# Patient Record
Sex: Female | Born: 1965 | Race: Black or African American | Hispanic: No | Marital: Married | Smoking: Former smoker
Health system: Southern US, Community
[De-identification: ages and names within clinical notes are randomized; demographics above are authoritative.]

## PROBLEM LIST (undated history)

## (undated) DIAGNOSIS — F32A Depression, unspecified: Secondary | ICD-10-CM

## (undated) DIAGNOSIS — Z8701 Personal history of pneumonia (recurrent): Secondary | ICD-10-CM

## (undated) DIAGNOSIS — J189 Pneumonia, unspecified organism: Secondary | ICD-10-CM

## (undated) DIAGNOSIS — D693 Immune thrombocytopenic purpura: Secondary | ICD-10-CM

## (undated) DIAGNOSIS — I10 Essential (primary) hypertension: Secondary | ICD-10-CM

## (undated) DIAGNOSIS — M5020 Other cervical disc displacement, unspecified cervical region: Secondary | ICD-10-CM

## (undated) DIAGNOSIS — R7303 Prediabetes: Secondary | ICD-10-CM

## (undated) DIAGNOSIS — F419 Anxiety disorder, unspecified: Secondary | ICD-10-CM

## (undated) DIAGNOSIS — Z8669 Personal history of other diseases of the nervous system and sense organs: Secondary | ICD-10-CM

## (undated) DIAGNOSIS — D869 Sarcoidosis, unspecified: Secondary | ICD-10-CM

## (undated) DIAGNOSIS — F329 Major depressive disorder, single episode, unspecified: Secondary | ICD-10-CM

## (undated) DIAGNOSIS — K219 Gastro-esophageal reflux disease without esophagitis: Secondary | ICD-10-CM

## (undated) DIAGNOSIS — G4730 Sleep apnea, unspecified: Secondary | ICD-10-CM

## (undated) HISTORY — DX: Immune thrombocytopenic purpura: D69.3

## (undated) HISTORY — DX: Sarcoidosis, unspecified: D86.9

## (undated) HISTORY — PX: BONE MARROW BIOPSY: SHX199

## (undated) HISTORY — PX: LAPAROSCOPIC SPLENECTOMY: SUR796

## (undated) HISTORY — PX: TUBAL LIGATION: SHX77

## (undated) HISTORY — PX: OTHER SURGICAL HISTORY: SHX169

## (undated) HISTORY — PX: CHOLECYSTECTOMY: SHX55

## (undated) HISTORY — PX: BACK SURGERY: SHX140

## (undated) HISTORY — PX: PERCUTANEOUS LIVER BIOPSY: SUR136

---

## 2008-05-29 ENCOUNTER — Inpatient Hospital Stay (HOSPITAL_COMMUNITY): Admission: EM | Admit: 2008-05-29 | Discharge: 2008-06-02 | Payer: Self-pay | Admitting: Emergency Medicine

## 2008-05-29 ENCOUNTER — Ambulatory Visit: Payer: Self-pay | Admitting: Internal Medicine

## 2008-06-14 ENCOUNTER — Ambulatory Visit: Payer: Self-pay | Admitting: Internal Medicine

## 2008-07-05 LAB — COMPREHENSIVE METABOLIC PANEL
AST: 56 U/L — ABNORMAL HIGH (ref 0–37)
BUN: 11 mg/dL (ref 6–23)
CO2: 24 mEq/L (ref 19–32)
Calcium: 9.3 mg/dL (ref 8.4–10.5)
Chloride: 105 mEq/L (ref 96–112)
Creatinine, Ser: 0.93 mg/dL (ref 0.40–1.20)

## 2008-07-05 LAB — CBC WITH DIFFERENTIAL/PLATELET
Basophils Absolute: 0 10*3/uL (ref 0.0–0.1)
EOS%: 2.2 % (ref 0.0–7.0)
HCT: 36.4 % (ref 34.8–46.6)
HGB: 12.4 g/dL (ref 11.6–15.9)
LYMPH%: 22.7 % (ref 14.0–48.0)
MCH: 27.3 pg (ref 26.0–34.0)
NEUT%: 62.4 % (ref 39.6–76.8)
Platelets: 137 10*3/uL — ABNORMAL LOW (ref 145–400)
lymph#: 1.2 10*3/uL (ref 0.9–3.3)

## 2008-08-21 ENCOUNTER — Ambulatory Visit: Payer: Self-pay | Admitting: Internal Medicine

## 2008-08-21 LAB — CBC WITH DIFFERENTIAL/PLATELET
BASO%: 0.4 % (ref 0.0–2.0)
Basophils Absolute: 0 10*3/uL (ref 0.0–0.1)
HCT: 37 % (ref 34.8–46.6)
HGB: 12.6 g/dL (ref 11.6–15.9)
LYMPH%: 27.5 % (ref 14.0–48.0)
MCHC: 34 g/dL (ref 32.0–36.0)
MONO#: 0.7 10*3/uL (ref 0.1–0.9)
NEUT%: 55 % (ref 39.6–76.8)
Platelets: 39 10*3/uL — ABNORMAL LOW (ref 145–400)
WBC: 5.1 10*3/uL (ref 3.9–10.0)
lymph#: 1.4 10*3/uL (ref 0.9–3.3)

## 2008-09-21 ENCOUNTER — Ambulatory Visit: Payer: Self-pay | Admitting: Internal Medicine

## 2008-09-21 DIAGNOSIS — D869 Sarcoidosis, unspecified: Secondary | ICD-10-CM | POA: Insufficient documentation

## 2008-09-21 DIAGNOSIS — R0609 Other forms of dyspnea: Secondary | ICD-10-CM | POA: Insufficient documentation

## 2008-09-21 DIAGNOSIS — R0989 Other specified symptoms and signs involving the circulatory and respiratory systems: Secondary | ICD-10-CM

## 2008-10-09 ENCOUNTER — Ambulatory Visit: Payer: Self-pay | Admitting: Internal Medicine

## 2008-10-11 LAB — CBC WITH DIFFERENTIAL/PLATELET
Basophils Absolute: 0.1 10*3/uL (ref 0.0–0.1)
Eosinophils Absolute: 0.2 10*3/uL (ref 0.0–0.5)
HGB: 12.7 g/dL (ref 11.6–15.9)
MONO#: 0.8 10*3/uL (ref 0.1–0.9)
MONO%: 10.4 % (ref 0.0–13.0)
NEUT#: 4.1 10*3/uL (ref 1.5–6.5)
RBC: 4.87 10*6/uL (ref 3.70–5.32)
RDW: 14 % (ref 11.3–14.5)
WBC: 7.3 10*3/uL (ref 3.9–10.0)
lymph#: 2.1 10*3/uL (ref 0.9–3.3)

## 2008-10-11 LAB — COMPREHENSIVE METABOLIC PANEL
AST: 41 U/L — ABNORMAL HIGH (ref 0–37)
BUN: 22 mg/dL (ref 6–23)
CO2: 23 mEq/L (ref 19–32)
Calcium: 9.1 mg/dL (ref 8.4–10.5)
Chloride: 105 mEq/L (ref 96–112)
Creatinine, Ser: 1.07 mg/dL (ref 0.40–1.20)
Glucose, Bld: 87 mg/dL (ref 70–99)

## 2008-10-11 LAB — LACTATE DEHYDROGENASE: LDH: 146 U/L (ref 94–250)

## 2008-10-11 LAB — TECHNOLOGIST REVIEW

## 2008-10-18 LAB — CBC WITH DIFFERENTIAL/PLATELET
Basophils Absolute: 0 10*3/uL (ref 0.0–0.1)
Eosinophils Absolute: 0.2 10*3/uL (ref 0.0–0.5)
HGB: 13.1 g/dL (ref 11.6–15.9)
NEUT#: 6.2 10*3/uL (ref 1.5–6.5)
RDW: 15.1 % — ABNORMAL HIGH (ref 11.3–14.5)
WBC: 8.4 10*3/uL (ref 3.9–10.0)
lymph#: 1.3 10*3/uL (ref 0.9–3.3)

## 2008-10-18 LAB — LACTATE DEHYDROGENASE: LDH: 166 U/L (ref 94–250)

## 2008-10-19 ENCOUNTER — Encounter: Admission: RE | Admit: 2008-10-19 | Discharge: 2008-11-27 | Payer: Self-pay | Admitting: Internal Medicine

## 2008-10-23 ENCOUNTER — Ambulatory Visit: Payer: Self-pay | Admitting: Internal Medicine

## 2008-10-25 LAB — CBC WITH DIFFERENTIAL/PLATELET
Eosinophils Absolute: 0.2 10*3/uL (ref 0.0–0.5)
HCT: 37 % (ref 34.8–46.6)
LYMPH%: 26.8 % (ref 14.0–48.0)
MONO#: 0.8 10*3/uL (ref 0.1–0.9)
NEUT#: 4.1 10*3/uL (ref 1.5–6.5)
NEUT%: 58.1 % (ref 39.6–76.8)
Platelets: 38 10*3/uL — ABNORMAL LOW (ref 145–400)
RBC: 4.66 10*6/uL (ref 3.70–5.32)
WBC: 7.1 10*3/uL (ref 3.9–10.0)
lymph#: 1.9 10*3/uL (ref 0.9–3.3)

## 2008-11-01 LAB — CBC WITH DIFFERENTIAL/PLATELET
Basophils Absolute: 0 10*3/uL (ref 0.0–0.1)
Eosinophils Absolute: 0.1 10*3/uL (ref 0.0–0.5)
HGB: 12.6 g/dL (ref 11.6–15.9)
LYMPH%: 13.1 % — ABNORMAL LOW (ref 14.0–48.0)
MCV: 80.4 fL — ABNORMAL LOW (ref 81.0–101.0)
MONO%: 7.5 % (ref 0.0–13.0)
NEUT#: 7.1 10*3/uL — ABNORMAL HIGH (ref 1.5–6.5)
Platelets: 26 10*3/uL — ABNORMAL LOW (ref 145–400)

## 2008-11-06 LAB — CBC WITH DIFFERENTIAL/PLATELET
BASO%: 0.4 % (ref 0.0–2.0)
Basophils Absolute: 0.1 10*3/uL (ref 0.0–0.1)
HCT: 35.8 % (ref 34.8–46.6)
LYMPH%: 10.4 % — ABNORMAL LOW (ref 14.0–48.0)
MCH: 27 pg (ref 26.0–34.0)
MCHC: 33.6 g/dL (ref 32.0–36.0)
MONO#: 0.8 10*3/uL (ref 0.1–0.9)
NEUT%: 82.5 % — ABNORMAL HIGH (ref 39.6–76.8)
Platelets: 53 10*3/uL — ABNORMAL LOW (ref 145–400)
WBC: 12.4 10*3/uL — ABNORMAL HIGH (ref 3.9–10.0)

## 2008-11-17 LAB — CBC WITH DIFFERENTIAL/PLATELET
BASO%: 1.2 % (ref 0.0–2.0)
EOS%: 2 % (ref 0.0–7.0)
HCT: 35.9 % (ref 34.8–46.6)
LYMPH%: 31.4 % (ref 14.0–48.0)
MCH: 26.7 pg (ref 26.0–34.0)
MCHC: 33.8 g/dL (ref 32.0–36.0)
MCV: 79.1 fL — ABNORMAL LOW (ref 81.0–101.0)
MONO%: 9 % (ref 0.0–13.0)
NEUT%: 56.5 % (ref 39.6–76.8)
lymph#: 2.7 10*3/uL (ref 0.9–3.3)

## 2008-11-17 LAB — LACTATE DEHYDROGENASE: LDH: 216 U/L (ref 94–250)

## 2008-11-22 ENCOUNTER — Ambulatory Visit: Payer: Self-pay | Admitting: Internal Medicine

## 2008-11-22 LAB — CBC WITH DIFFERENTIAL/PLATELET
BASO%: 0.6 % (ref 0.0–2.0)
EOS%: 6.3 % (ref 0.0–7.0)
HCT: 34.2 % — ABNORMAL LOW (ref 34.8–46.6)
HGB: 11.6 g/dL (ref 11.6–15.9)
MCH: 27.7 pg (ref 26.0–34.0)
MCHC: 34 g/dL (ref 32.0–36.0)
MONO#: 0.6 10*3/uL (ref 0.1–0.9)
NEUT%: 55.5 % (ref 39.6–76.8)
RDW: 17 % — ABNORMAL HIGH (ref 11.3–14.5)
WBC: 5.7 10*3/uL (ref 3.9–10.0)
lymph#: 1.5 10*3/uL (ref 0.9–3.3)

## 2008-11-22 LAB — LACTATE DEHYDROGENASE: LDH: 190 U/L (ref 94–250)

## 2008-12-08 LAB — CBC WITH DIFFERENTIAL/PLATELET
BASO%: 0.3 % (ref 0.0–2.0)
EOS%: 3.8 % (ref 0.0–7.0)
HCT: 36.7 % (ref 34.8–46.6)
LYMPH%: 26.9 % (ref 14.0–48.0)
MCH: 28.5 pg (ref 26.0–34.0)
MCHC: 34.1 g/dL (ref 32.0–36.0)
MCV: 83.7 fL (ref 81.0–101.0)
MONO%: 13.2 % — ABNORMAL HIGH (ref 0.0–13.0)
NEUT%: 55.8 % (ref 39.6–76.8)
Platelets: 217 10*3/uL (ref 145–400)
lymph#: 1.8 10*3/uL (ref 0.9–3.3)

## 2008-12-08 LAB — LACTATE DEHYDROGENASE: LDH: 195 U/L (ref 94–250)

## 2008-12-11 LAB — CBC WITH DIFFERENTIAL/PLATELET
Basophils Absolute: 0 10*3/uL (ref 0.0–0.1)
Eosinophils Absolute: 0.2 10*3/uL (ref 0.0–0.5)
HGB: 11.9 g/dL (ref 11.6–15.9)
NEUT#: 3.2 10*3/uL (ref 1.5–6.5)
RDW: 17.4 % — ABNORMAL HIGH (ref 11.3–14.5)
lymph#: 1.9 10*3/uL (ref 0.9–3.3)

## 2009-01-04 ENCOUNTER — Ambulatory Visit: Payer: Self-pay | Admitting: Internal Medicine

## 2009-01-04 LAB — CBC WITH DIFFERENTIAL/PLATELET
Basophils Absolute: 0 10*3/uL (ref 0.0–0.1)
Eosinophils Absolute: 0.4 10*3/uL (ref 0.0–0.5)
HGB: 12.7 g/dL (ref 11.6–15.9)
MONO#: 0.7 10*3/uL (ref 0.1–0.9)
NEUT#: 2.2 10*3/uL (ref 1.5–6.5)
RDW: 15.2 % — ABNORMAL HIGH (ref 11.3–14.5)
lymph#: 1.4 10*3/uL (ref 0.9–3.3)

## 2009-01-09 LAB — CBC WITH DIFFERENTIAL/PLATELET
Basophils Absolute: 0 10*3/uL (ref 0.0–0.1)
Eosinophils Absolute: 0.3 10*3/uL (ref 0.0–0.5)
HGB: 12.1 g/dL (ref 11.6–15.9)
LYMPH%: 27 % (ref 14.0–48.0)
MCV: 81.5 fL (ref 81.0–101.0)
MONO%: 12 % (ref 0.0–13.0)
NEUT#: 3.6 10*3/uL (ref 1.5–6.5)
Platelets: 334 10*3/uL (ref 145–400)

## 2009-01-18 LAB — CBC WITH DIFFERENTIAL/PLATELET
BASO%: 0.4 % (ref 0.0–2.0)
Basophils Absolute: 0 10*3/uL (ref 0.0–0.1)
HCT: 36.8 % (ref 34.8–46.6)
HGB: 12.6 g/dL (ref 11.6–15.9)
LYMPH%: 23.4 % (ref 14.0–48.0)
MCH: 28 pg (ref 26.0–34.0)
MCHC: 34.2 g/dL (ref 32.0–36.0)
MONO#: 0.8 10*3/uL (ref 0.1–0.9)
NEUT%: 53.8 % (ref 39.6–76.8)
Platelets: 185 10*3/uL (ref 145–400)
WBC: 5.1 10*3/uL (ref 3.9–10.0)

## 2009-02-07 LAB — CBC WITH DIFFERENTIAL/PLATELET
Eosinophils Absolute: 0.2 10*3/uL (ref 0.0–0.5)
MCV: 81.9 fL (ref 79.5–101.0)
MONO%: 15.7 % — ABNORMAL HIGH (ref 0.0–14.0)
NEUT#: 3 10*3/uL (ref 1.5–6.5)
RBC: 4.35 10*6/uL (ref 3.70–5.45)
RDW: 15.4 % — ABNORMAL HIGH (ref 11.2–14.5)
WBC: 5.2 10*3/uL (ref 3.9–10.3)
lymph#: 1.1 10*3/uL (ref 0.9–3.3)

## 2009-02-07 LAB — LACTATE DEHYDROGENASE: LDH: 173 U/L (ref 94–250)

## 2009-02-20 ENCOUNTER — Ambulatory Visit: Payer: Self-pay | Admitting: Internal Medicine

## 2009-02-22 LAB — CBC WITH DIFFERENTIAL/PLATELET
BASO%: 0.5 % (ref 0.0–2.0)
HCT: 38 % (ref 34.8–46.6)
MCHC: 33.2 g/dL (ref 31.5–36.0)
MONO#: 1 10*3/uL — ABNORMAL HIGH (ref 0.1–0.9)
NEUT%: 49.7 % (ref 38.4–76.8)
RBC: 4.77 10*6/uL (ref 3.70–5.45)
WBC: 5.9 10*3/uL (ref 3.9–10.3)
lymph#: 1.7 10*3/uL (ref 0.9–3.3)
nRBC: 0 % (ref 0–0)

## 2009-03-08 LAB — CBC WITH DIFFERENTIAL/PLATELET
BASO%: 0.6 % (ref 0.0–2.0)
EOS%: 4.5 % (ref 0.0–7.0)
HCT: 36.9 % (ref 34.8–46.6)
LYMPH%: 20.9 % (ref 14.0–49.7)
MCH: 28 pg (ref 25.1–34.0)
MCHC: 34.2 g/dL (ref 31.5–36.0)
NEUT%: 62 % (ref 38.4–76.8)
Platelets: 151 10*3/uL (ref 145–400)
RBC: 4.5 10*6/uL (ref 3.70–5.45)

## 2009-03-08 LAB — LACTATE DEHYDROGENASE: LDH: 200 U/L (ref 94–250)

## 2009-03-23 LAB — CBC WITH DIFFERENTIAL/PLATELET
BASO%: 0.9 % (ref 0.0–2.0)
EOS%: 4.6 % (ref 0.0–7.0)
MCH: 27.2 pg (ref 25.1–34.0)
MCHC: 33.7 g/dL (ref 31.5–36.0)
MCV: 80.7 fL (ref 79.5–101.0)
MONO%: 10.9 % (ref 0.0–14.0)
RBC: 4.82 10*6/uL (ref 3.70–5.45)
RDW: 14.9 % — ABNORMAL HIGH (ref 11.2–14.5)

## 2009-04-05 ENCOUNTER — Encounter (INDEPENDENT_AMBULATORY_CARE_PROVIDER_SITE_OTHER): Payer: Self-pay | Admitting: General Surgery

## 2009-04-05 ENCOUNTER — Inpatient Hospital Stay (HOSPITAL_COMMUNITY): Admission: RE | Admit: 2009-04-05 | Discharge: 2009-04-08 | Payer: Self-pay | Admitting: General Surgery

## 2009-04-16 ENCOUNTER — Ambulatory Visit: Payer: Self-pay | Admitting: Internal Medicine

## 2009-04-18 LAB — CBC WITH DIFFERENTIAL/PLATELET
Basophils Absolute: 0.1 10*3/uL (ref 0.0–0.1)
EOS%: 7.1 % — ABNORMAL HIGH (ref 0.0–7.0)
Eosinophils Absolute: 1 10*3/uL — ABNORMAL HIGH (ref 0.0–0.5)
LYMPH%: 16.5 % (ref 14.0–49.7)
MCH: 27.6 pg (ref 25.1–34.0)
MCV: 83.3 fL (ref 79.5–101.0)
MONO%: 15.4 % — ABNORMAL HIGH (ref 0.0–14.0)
NEUT#: 8.1 10*3/uL — ABNORMAL HIGH (ref 1.5–6.5)
Platelets: 772 10*3/uL — ABNORMAL HIGH (ref 145–400)
RBC: 4.47 10*6/uL (ref 3.70–5.45)
RDW: 15.9 % — ABNORMAL HIGH (ref 11.2–14.5)

## 2009-04-18 LAB — LACTATE DEHYDROGENASE: LDH: 197 U/L (ref 94–250)

## 2009-05-06 ENCOUNTER — Emergency Department (HOSPITAL_COMMUNITY): Admission: EM | Admit: 2009-05-06 | Discharge: 2009-05-06 | Payer: Self-pay | Admitting: Emergency Medicine

## 2009-07-16 ENCOUNTER — Ambulatory Visit: Payer: Self-pay | Admitting: Internal Medicine

## 2010-10-17 ENCOUNTER — Emergency Department (HOSPITAL_COMMUNITY): Admission: EM | Admit: 2010-10-17 | Discharge: 2010-10-18 | Payer: Self-pay | Admitting: Emergency Medicine

## 2011-02-08 ENCOUNTER — Emergency Department (HOSPITAL_COMMUNITY)
Admission: EM | Admit: 2011-02-08 | Discharge: 2011-02-08 | Disposition: A | Discharge status: Home / Self Care | Payer: Medicare Other | Attending: Emergency Medicine | Admitting: Emergency Medicine

## 2011-02-08 DIAGNOSIS — M5430 Sciatica, unspecified side: Secondary | ICD-10-CM | POA: Insufficient documentation

## 2011-02-08 DIAGNOSIS — J45909 Unspecified asthma, uncomplicated: Secondary | ICD-10-CM | POA: Insufficient documentation

## 2011-02-08 DIAGNOSIS — D693 Immune thrombocytopenic purpura: Secondary | ICD-10-CM | POA: Insufficient documentation

## 2011-02-08 DIAGNOSIS — D869 Sarcoidosis, unspecified: Secondary | ICD-10-CM | POA: Insufficient documentation

## 2011-02-11 LAB — RAPID STREP SCREEN (MED CTR MEBANE ONLY): Streptococcus, Group A Screen (Direct): NEGATIVE

## 2011-03-08 ENCOUNTER — Emergency Department (HOSPITAL_COMMUNITY)
Admission: EM | Admit: 2011-03-08 | Discharge: 2011-03-09 | Disposition: A | Discharge status: Home / Self Care | Payer: Medicare Other | Attending: Emergency Medicine | Admitting: Emergency Medicine

## 2011-03-08 DIAGNOSIS — M7989 Other specified soft tissue disorders: Secondary | ICD-10-CM | POA: Insufficient documentation

## 2011-03-08 DIAGNOSIS — D869 Sarcoidosis, unspecified: Secondary | ICD-10-CM | POA: Insufficient documentation

## 2011-03-09 LAB — POCT I-STAT, CHEM 8
Calcium, Ion: 1.19 mmol/L (ref 1.12–1.32)
HCT: 41 % (ref 36.0–46.0)
TCO2: 27 mmol/L (ref 0–100)

## 2011-03-10 LAB — COMPREHENSIVE METABOLIC PANEL
AST: 66 U/L — ABNORMAL HIGH (ref 0–37)
Albumin: 3.2 g/dL — ABNORMAL LOW (ref 3.5–5.2)
Chloride: 104 mEq/L (ref 96–112)
Creatinine, Ser: 0.74 mg/dL (ref 0.4–1.2)
GFR calc Af Amer: >60 mL/min (ref >60)
Potassium: 3.8 mEq/L (ref 3.5–5.1)
Total Bilirubin: 0.7 mg/dL (ref 0.3–1.2)

## 2011-03-10 LAB — DIFFERENTIAL
Basophils Absolute: 0.1 10*3/uL (ref 0.0–0.1)
Eosinophils Relative: 6 % — ABNORMAL HIGH (ref 0–5)
Lymphocytes Relative: 18 % (ref 12–46)
Monocytes Absolute: 1.7 10*3/uL — ABNORMAL HIGH (ref 0.1–1.0)
Monocytes Relative: 13 % — ABNORMAL HIGH (ref 3–12)

## 2011-03-10 LAB — URINALYSIS, ROUTINE W REFLEX MICROSCOPIC
Glucose, UA: NEGATIVE mg/dL
Hgb urine dipstick: NEGATIVE
Specific Gravity, Urine: 1.019 (ref 1.005–1.030)
pH: 5.5 (ref 5.0–8.0)

## 2011-03-10 LAB — URINE CULTURE: Colony Count: 3000

## 2011-03-10 LAB — CBC
MCV: 83.4 fL (ref 78.0–100.0)
Platelets: 285 10*3/uL (ref 150–400)
WBC: 13.1 10*3/uL — ABNORMAL HIGH (ref 4.0–10.5)

## 2011-03-11 LAB — ABO/RH: ABO/RH(D): A POS

## 2011-03-11 LAB — TYPE AND SCREEN: ABO/RH(D): A POS

## 2011-03-11 LAB — CBC
HCT: 33.9 % — ABNORMAL LOW (ref 36.0–46.0)
HCT: 34.8 % — ABNORMAL LOW (ref 36.0–46.0)
Hemoglobin: 11.5 g/dL — ABNORMAL LOW (ref 12.0–15.0)
MCHC: 34.5 g/dL (ref 30.0–36.0)
Platelets: 151 10*3/uL (ref 150–400)
Platelets: 163 10*3/uL (ref 150–400)
RBC: 4.07 MIL/uL (ref 3.87–5.11)
RDW: 15.5 % (ref 11.5–15.5)
RDW: 15.9 % — ABNORMAL HIGH (ref 11.5–15.5)
RDW: 15.9 % — ABNORMAL HIGH (ref 11.5–15.5)
WBC: 15.5 10*3/uL — ABNORMAL HIGH (ref 4.0–10.5)

## 2011-03-11 LAB — COMPREHENSIVE METABOLIC PANEL
ALT: 45 U/L — ABNORMAL HIGH (ref 0–35)
Albumin: 3.2 g/dL — ABNORMAL LOW (ref 3.5–5.2)
Alkaline Phosphatase: 203 U/L — ABNORMAL HIGH (ref 39–117)
Calcium: 9 mg/dL (ref 8.4–10.5)
Potassium: 3.5 mEq/L (ref 3.5–5.1)
Sodium: 136 mEq/L (ref 135–145)
Total Protein: 8.3 g/dL (ref 6.0–8.3)

## 2011-03-11 LAB — DIFFERENTIAL
Basophils Relative: 1 % (ref 0–1)
Eosinophils Absolute: 0.2 10*3/uL (ref 0.0–0.7)
Lymphs Abs: 1.6 10*3/uL (ref 0.7–4.0)
Monocytes Absolute: 0.6 10*3/uL (ref 0.1–1.0)
Monocytes Relative: 10 % (ref 3–12)
Neutro Abs: 3.6 10*3/uL (ref 1.7–7.7)

## 2011-03-11 LAB — PROTIME-INR: INR: 1 (ref 0.00–1.49)

## 2011-04-15 NOTE — Discharge Summary (Signed)
NAMEJAMETTA, [name]                ACCOUNT NO.:  [id number]   MEDICAL RECORD NO.:  [id number]          PATIENT TYPE:  INP   LOCATION:  1319                         FACILITY:  [facility]   PHYSICIAN:  Eduard Clos, MDDATE OF BIRTH:  01-14-1966   DATE OF ADMISSION:  05/29/2008  DATE OF DISCHARGE:                               DISCHARGE SUMMARY   COURSE IN THE HOSPITAL:  [age]-year-old female with known history  of IDP, sarcoidosis, bronchial asthma presented to the ER complaining of  increasing bruising of the skin.  On admission the patient had a CBC  done which showed a platelet count around 12.  The patient was admitted  and got a unit of platelet transfusion.  Hematologist Dr. Arbutus Ped was  consulted.  Per Dr. Arbutus Ped IV steroids were started and advised p.r.n.  platelet transfusion as needed.  The patient's platelet count slowly  started improving, and per hematologist they have found more than  50,000.  He can be discharged home. During his stay the patient also had  UA which had features compatible with UTI.  Cultures only grew 6000  colonies of E. coli.  The patient also had a CAT scan of her head done  due to headache which did not show any acute findings at this time.  As  the patient's platelet counts have increased more than 8000 the patient  is discharged home with advice to follow up with Dr. Arbutus Ped on June 05, 2008 for further workup.  At time of discharge the patient is  hemodynamically stable.   ASSESSMENT:  1. 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  2. Urinary tract infection.  3. Sarcoidosis.  4. History of bronchial asthma.   MEDICATIONS AT DISCHARGE:  1. Medrol 24 mg p.o. daily.  2. Albuterol inhaler 2 puffs q.6 p.r.n.  3. Advair Diskus 250/50 1 puff twice daily.  4. Ciprofloxacin p.o. b.i.d. for three days.   PLAN:  The patient advised to follow with Dr. Arbutus Ped, hematologist, at  the [facility] on  June 05, 2008 to follow with her primary  care physician within a week's time.  The patient advised that  ciprofloxacin should not be taken if planning pregnancy.      Eduard Clos, MD  Electronically Signed     ANK/MEDQ  D:  06/02/2008  T:  06/02/2008  Job:  [id number]

## 2011-04-15 NOTE — Consult Note (Signed)
[name], [name]                ACCOUNT NO.:  [id number]   MEDICAL RECORD NO.:  [id number]          PATIENT TYPE:  INP   LOCATION:  1601                         FACILITY:  [facility]   PHYSICIAN:  Lajuana Matte, MD  DATE OF BIRTH:  11-Apr-1966   DATE OF CONSULTATION:  05/30/2008  DATE OF DISCHARGE:                                 CONSULTATION   REASON FOR CONSULTATION:  Thrombocytopenia.   HISTORY:  We were asked to see this pleasant 45 year old African  American female with a known history of ITP sarcoidosis and  hepatosplenomegaly for thrombocytopenia.  [name] recently moved to  [place] from [place], [place].  She was seen at an urgent  care facility for complaints of fatigue, found to have a UTI and low  platelet counts, and was sent to the emergency room for further  evaluation and management.  Her platelet count in the emergency room  here at [facility] was 12,000.   REVIEW OF SYSTEMS:  The patient noted bruising on her upper and lower  extremities with associated fatigue.  She also had dysuria.  No fevers  or chills.  The remainder of her review of systems was negative.   PAST MEDICAL HISTORY:  Significant for a history of sarcoidosis, ITP,  and bronchial asthma.  Her sarcoidosis symptoms began in 1990, but were  not diagnosed until 1994.  She had previously been treated with platelet  transfusion and steroids.  Regarding her history of ITP, her platelets  were found to be low at the time of evaluation for a herniated cervical  disk in 2003.  She was hospitalized in April of 2007 for profuse vaginal  bleeding, and at that time was diagnosed with ITP.  She has a history of  hepatosplenomegaly related to her sarcoidosis.  She was hospitalized for  bilateral pneumonia in the past.  She is status post cholecystectomy, C-  section x2, tubal ligation.  She is also status post liver biopsy x2.  She has had 2 rigid and 2 flexible bronchoscopies.  She is  status post  left stapedectomy and had some associated Bell's palsy.  She had bone  marrow biopsy in January of 2003.  She had a colonoscopy/endoscopy done  some time between 2001 and 2002.   FAMILY HISTORY:  Unknown, as the patient is adopted.   SOCIAL HISTORY:  She is married.  Has 2 children.  She is currently on  disability, but previously worked in Clinical biochemist.  She briefly  smoked as a teenager, but none since that time.  She denied any drug or  alcohol use or abuse.   ALLERGIES:  The patient is allergic to codeine, which causes hives.   PHYSICAL EXAMINATION:  Reveals a blood pressure of 125/78 with a pulse  of 73, temperature 98.0, respirations 18, oxygen saturation rate 95% on  room air.  GENERAL:  The patient is awake and alert, in no acute distress.  She is  oriented to person, time, and place.  HEENT:  Normocephalic, atraumatic.  Oropharynx is entirely clear.  NECK:  Supple without lymphadenopathy.  CHEST:  Clear to auscultation without wheeze, rales, or rhonchi.  No  dullness to percussion.  CARDIOVASCULAR:  Revealed a regular rate and rhythm with a normal S1,  S2.  No appreciable murmurs, gallops, or rubs.  ABDOMEN:  Obese, soft, nontender, nondistended.  No appreciable masses.  EXTREMITIES:  Her lower extremities are without edema.  However, there  were scattered areas of ecchymosis on the upper and lower extremities in  varying stages of healing.   LABORATORY DATA:  Revealed a white count of 5.0 with an ANC of 11.7,  hematocrit 34.5, platelets 21,000.  Sodium 138, potassium 3.7, chloride  107, CO2 20, BUN 12, creatinine 0.88, glucose 92, calcium 9.1, alkaline  phosphatase 211.  Remainder of the CMET was within normal range.   ASSESSMENT AND PLAN:  1. Thrombocytopenia in a patient with a history of known idiopathic      thrombocytopenic purpura.  We would recommend that her current      steroids be changed to Decadron 20 mg IV given b.i.d. and continue       to monitor a platelet count.  Transfuse platelets as needed.  Also      recommend obtaining her records from her [place] area doctors.  2. For her urinary tract infection, asthma, and sarcoidosis, continued      treatment and management per the [facility] physician.   Thank you for allowing [place] to take part in [name]' care.  We will  continue to follow her with you during this admission.      Tiana Loft, PA.      Lajuana Matte, MD  Electronically Signed    AJ/MEDQ  D:  05/31/2008  T:  05/31/2008  Job:  [id number]

## 2011-04-15 NOTE — H&P (Signed)
[name], BOSS                ACCOUNT NO.:  [id number]   MEDICAL RECORD NO.:  [id number]          PATIENT TYPE:  EMS   LOCATION:  ED                           FACILITY:  [facility]   PHYSICIAN:  Eduard Clos, MDDATE OF BIRTH:  [birth year]   DATE OF ADMISSION:  05/29/2008  DATE OF DISCHARGE:                              HISTORY & PHYSICAL   CHIEF COMPLAINT:  The patient came to the ER for low platelets.   HISTORY OF PRESENT ILLNESS:  A 45 year old female with known history of  ITP, sarcoidosis with hepatosplenomegaly, bronchial asthma who came to  the ER because the patient stated that she was found to have low  platelets at a clinic when she had her blood work done. The patient  stated that she was feeling fatigued and had some bruises on her  extremities. Told that her ITP was getting evaluated. Went to a local  clinic where she had a CBC done. At that time her platelets were found  to be around 30,000. She came to the ER. In the ER, the patient had a  platelet count of 12,000 with bruises in the extremities. The patient  was admitted for further workup and management of her thrombocytopenia.  The patient states that over the last two days she has been having some  fatigue which usually happens when her ITP exacerbates.  The patient was  found to have bruises all over her upper and lower extremities, chest  and back. The patient denies any frank bleeding in the rectum or any  vaginal bleeding. The patient did notice some stool with abnormal color  two days ago and what she thought was the regular brown color yesterday  and today. Denies any abdominal pain, nausea, vomiting, dizziness,  headache, any weakness of limb, fever or chills. The patient states that  she did get admitted for similar complaints in April and February when  she was transfused platelets and given steroids.   PAST MEDICAL HISTORY:  1. ITP.  2. Sarcoidosis.  3. Bronchial asthma.   PAST SURGICAL  HISTORY:  1. Tubal ligation.  2. Cesarean section.  3. Cholecystectomy.  4. Left ear stapedectomy.   MEDICATIONS PRIOR TO ADMISSION:  1. Medrol 24 mg p.o. daily.  2. Albuterol inhaler and nebulizer as needed.  3. Advair discus.  4. The patient also takes Alcalak.   ALLERGIES:  CODEINE.   FAMILY HISTORY:  Noncontributory.   SOCIAL HISTORY:  The patient lives with her husband. Denies smoking  cigarettes, drinking alcohol or using any illegal drugs.   REVIEW OF SYSTEMS:  As per history or presenting illness.   PHYSICAL EXAMINATION:  The patient is examined at bedside. Is not in  acute distress.  VITAL SIGNS: Blood pressure 110/60, pulse 80, temperature 98.1  GENERAL: Alert and oriented to place, time, and person.  HEENT: Anicteric, no pallor.  CHEST:  Bilaterally air entry present. No rhonchi on auscultation.  HEART:  S1, S2 heard.  ABDOMEN: Soft with hepatosplenomegaly. Bowel sounds heard. No guarding  or rigidity.  CNS:  The patient is alert, oriented to time,  place and person.  There are multiple bruises on lower extremities and also a large bruise  on her right arm with smaller bruises over her back and lower  extremities.   LABORATORY DATA:  CBC: WBC 6.5, hemoglobin 14.3, hematocrit 42,  platelets 12, neutrophils 62%.  Basic metabolic panel:  Sodium 140,  potassium 4, chloride 108, glucose 78, BUN 16, creatinine 1.   ASSESSMENT:  1. Idiopathic thrombocytopenia with history of idiopathic      thrombocytopenic purpura.  2. Sarcoidosis.  3. History of bronchial asthma.  4. Hard of hearing on the left side.  5. Obesity.   PLAN:  To admit the patient to medical floor to [facility].  The patient is already receiving blood transfusion. I have discussed  with on-call hematologist, Dr. Arbutus Ped who advised to continue with the  platelet transfusion and oral Medrol dose as she was taking at home. To  recheck a CBC, hemoglobin with PT/INR now and Dr. Arbutus Ped will  be  following the patient. Further recommendation as the patient's condition  evolves. Will try to obtain records from her previous physician at  [facility] where she had biopsies of her liver, spleen and bone marrow  done along with previous records for further medical management.      Eduard Clos, MD  Electronically Signed     ANK/MEDQ  D:  05/29/2008  T:  05/29/2008  Job:  [id number]

## 2011-04-15 NOTE — Op Note (Signed)
[name], [name]                ACCOUNT NO.:  [id number]   MEDICAL RECORD NO.:  [id number]          PATIENT TYPE:  OIB   LOCATION:  5127                         FACILITY:  [facility]   PHYSICIAN:  Adolph Pollack, M.D.DATE OF BIRTH:  06-Nov-1966   DATE OF PROCEDURE:  DATE OF DISCHARGE:                               OPERATIVE REPORT   PREOPERATIVE DIAGNOSIS:  ITP.   POSTOPERATIVE DIAGNOSIS:  ITP.   PROCEDURE:  Laparoscopic splenectomy.   SURGEON:  Adolph Pollack, MD   ASSISTANT:  Troy Sine. Dwain Sarna, MD   ANESTHESIA:  General.   INDICATIONS:  This 45 year old female has a long-standing history of ITP  requiring multiple treatments with with steroids.  She is somewhat  refractory to those and requires IVIG.  She now presents for  laparoscopic splenectomy.   TECHNIQUE:  She was brought to the operating room, placed supine on the  operating table, and a general anesthetic was administered.  She was  then placed with her left side up 45 degrees and the area was padded  appropriately.  Left arm was then placed somewhat above the head and  padded appropriately.  The abdominal wall, flank, and half of the back  on the left side was sterilely prepped and draped.   She was then turned supine.  A previous small subumbilical scar was  reincised and carried down to the fascia where a small incision was made  in the fascia and into the peritoneal cavity under direct vision.  A  Hasson trocar was introduced into the peritoneal cavity and  pneumoperitoneum created by insufflation of CO2 gas.   Next, an angle laparoscope was introduced.  I then placed a 5-mm trocar  through an incision in the subxiphoid region just to the left.  A 12-mm  trocar was placed in the left axillary posterior line.  An 11-mm trocar  was placed in the left axillary anterior line.  A 5-mm trocar was then  placed in the left medial abdomen.   Visualizing the liver appeared to be somewhat scarred.  The spleen  was  slightly enlarged.  I began by dissecting lateral attachments of the  spleen to the diaphragm and colon , dividing these with the Harmonic  scalpel mobilizing the lateral aspect of the spleen.  I subsequently  then continued the mobilization until I approached the hilum.  I then  began dividing short gastric vessels with the larger vessels divided  between clips using the Harmonic scalpel.  The small vessels were  divided with the Harmonic scalpel alone.  I then began dividing  attachments between the diaphragm and the medial and posterior aspect of  the spleen.  I did this until I had isolated the hilar vessels.  I  looked in the gastrohepatic ligament and I did not see any accessory  spleens.   I then held up the spleen between 2 instruments.  Using a vascular  stapler, the blood vessels at the hilum of the spleen close to the  spleen were then divided.  Hemostasis was adequate along the staple  line.  This is away  from the pancreatic tail.   Following this, there remained attachments between the superior aspect  of the spleen and diaphragm which were divided with the Harmonic  scalpel.  Once this was performed, I then removed the trocar in the  anterior axillary line and made a larger skin incision.  A retrieval bag  was then placed through the this incision, and the spleen was then put  into the retrieval bag and it was closed.  I then was able to pull part  of the bag out.  I then removed the spleen via morcellating it manually  with ring forceps.  There was no spillage of spleen contents.   Following this, gloves were changed.  I then inspected the left upper  quadrant and no active bleeding was noted.  The area was irrigated.  FloSeal was placed against the diaphragm and along the staple line.   The left posterior axillary line trocar was removed and the fascial  defect closed with 0 Vicryl suture using Endoclose device.  The anterior  axillary line fascial defect was  closed with a figure-of-eight 0 Vicryl  suture.  The remaining trocars were removed.  The supraumbilical fascial  defect was closed with 0 Vicryl suture.   All skin incisions were then closed with 4-0 Monocryl subcuticular  stitches followed by Steri-Strips and sterile dressings.  She tolerated  the procedure without any apparent complications and was taken to the  recovery in satisfactory condition.       Adolph Pollack, M.D.  Electronically Signed     TJR/MEDQ  D:  04/05/2009  T:  04/06/2009  Job:  [id number]   cc:   Lajuana Matte, MD

## 2011-04-15 NOTE — H&P (Signed)
[name], [name]                ACCOUNT NO.:  [id number]   MEDICAL RECORD NO.:  [id number]          PATIENT TYPE:  OIB   LOCATION:  5127                         FACILITY:  [facility]   PHYSICIAN:  Adolph Pollack, M.D.DATE OF BIRTH:  September 09, 1966   DATE OF ADMISSION:  04/05/2009  DATE OF DISCHARGE:                              HISTORY & PHYSICAL   REASON:  Elective splenectomy.   HISTORY:  MS. [name] is a 45 year old female who has had ITP for a number  of years.  She also has sarcoidosis.  She has been treated with multiple  rounds of steroids and the platelet count hovering around 50,000 when I  first saw her in December 2009.  She had been referred for an elective  splenectomy.  She wanted to complete her semester at school in the  spring and subsequently presents now for elective splenectomy.  Preoperatively, she has received her vaccines.   PAST MEDICAL HISTORY:  1. ITP.  2. Sarcoidosis with multiple hospitalizations for the pulmonary      issues.   PREVIOUS OPERATIONS:  1. Liver biopsies.  2. Scaphoidectomy.  3. Laparoscopic cholecystectomy.  4. Tubal ligation.  5. Two cesarean sections.   ALLERGIES:  CODEINE.   CURRENT MEDICATIONS:  Lasix and Klor-Con.   SOCIAL HISTORY:  She is married.  No tobacco or alcohol use.   FAMILY HISTORY:  She is adopted, so it is noncontributory.   PHYSICAL EXAMINATION:  GENERAL:  An obese female, who is in no acute  distress.  She is pleasant and cooperative.  VITAL SIGNS:  Temperature is 98 degrees, blood pressure is 120/75, pulse  71, O2 saturations 98% on room air.  NECK:  Supple without masses.  RESPIRATORY:  Breath sounds equal and clear, respirations unlabored.  CARDIOVASCULAR:  Regular rate, regular rhythm.  No murmur.  ABDOMEN:  Soft with lower midline scar and smaller supraumbilical scars.  I cannot obviously palpate a spleen or a liver at this time.  EXTREMITIES:  SCDs on.   LABORATORY DATA:  Notable for a platelet count  of 151,000 after  receiving intravenous immunoglobulin.  Liver function is also noted to  be mildly elevated except for a normal bilirubin.   IMPRESSION:  Idiopathic thrombocytopenic purpura.   PLAN:  Laparoscopic possible open splenectomy.      Adolph Pollack, M.D.  Electronically Signed     TJR/MEDQ  D:  04/05/2009  T:  04/06/2009  Job:  [id number]

## 2011-04-15 NOTE — Discharge Summary (Signed)
NAMEHEILY, [name]                ACCOUNT NO.:  [id number]   MEDICAL RECORD NO.:  [id number]          PATIENT TYPE:  INP   LOCATION:  5127                         FACILITY:  [facility]   PHYSICIAN:  Adolph Pollack, M.D.DATE OF BIRTH:  02/24/1966   DATE OF ADMISSION:  04/05/2009  DATE OF DISCHARGE:  04/08/2009                               DISCHARGE SUMMARY   DISCHARGE DIAGNOSIS:  Idiopathic thrombocytopenic purpura.   SECONDARY DIAGNOSIS:  Sarcoidosis.   PROCEDURE:  Laparoscopic splenectomy.   INDICATIONS:  This 45 year old female has had ITP, but is now becoming  refractory to some medical treatment.  Now she is admitted for elective  splenectomy.   HOSPITAL COURSE:  She underwent a laparoscopic splenectomy, which she  tolerated well.  On her first postop day, her platelet count was up to  163,000.  Her Foley was removed and she was mobilized.  She continued to  improve slowly and then was able to go home on Apr 08, 2009, in  satisfactory condition.   DISPOSITION:  Discharge to home on Apr 08, 2009, in satisfactory  condition.  Final pathology demonstrated splenic tissue with scattered  sarcoid-like granulomatous inflammation.  Her activity is limited to no  heavy lifting.  She will resume her home medicines and then was given  prescription for Percocet for pain.  She will follow up in the office in  2-3 weeks.      Adolph Pollack, M.D.  Electronically Signed     TJR/MEDQ  D:  05/07/2009  T:  05/08/2009  Job:  [id number]   cc:   Lajuana Matte, MD

## 2011-04-24 ENCOUNTER — Encounter: Payer: Self-pay | Admitting: Internal Medicine

## 2011-05-07 ENCOUNTER — Ambulatory Visit: Payer: Medicare Other | Admitting: Internal Medicine

## 2011-06-05 ENCOUNTER — Emergency Department (HOSPITAL_COMMUNITY): Payer: Medicare Other

## 2011-06-05 ENCOUNTER — Emergency Department (HOSPITAL_COMMUNITY)
Admission: EM | Admit: 2011-06-05 | Discharge: 2011-06-06 | Disposition: A | Discharge status: Home / Self Care | Payer: Medicare Other | Attending: Emergency Medicine | Admitting: Emergency Medicine

## 2011-06-05 DIAGNOSIS — R000 Tachycardia, unspecified: Secondary | ICD-10-CM | POA: Insufficient documentation

## 2011-06-05 DIAGNOSIS — D869 Sarcoidosis, unspecified: Secondary | ICD-10-CM | POA: Insufficient documentation

## 2011-06-05 DIAGNOSIS — J329 Chronic sinusitis, unspecified: Secondary | ICD-10-CM | POA: Insufficient documentation

## 2011-06-05 DIAGNOSIS — R0602 Shortness of breath: Secondary | ICD-10-CM | POA: Insufficient documentation

## 2011-06-05 DIAGNOSIS — R062 Wheezing: Secondary | ICD-10-CM | POA: Insufficient documentation

## 2011-06-05 DIAGNOSIS — J189 Pneumonia, unspecified organism: Secondary | ICD-10-CM | POA: Insufficient documentation

## 2011-06-17 ENCOUNTER — Emergency Department (HOSPITAL_COMMUNITY)
Admission: EM | Admit: 2011-06-17 | Discharge: 2011-06-18 | Disposition: A | Discharge status: Other Acute Inpatient Hospital | Payer: Medicare Other | Attending: Emergency Medicine | Admitting: Emergency Medicine

## 2011-06-17 ENCOUNTER — Emergency Department (HOSPITAL_COMMUNITY): Payer: Medicare Other

## 2011-06-17 DIAGNOSIS — D72829 Elevated white blood cell count, unspecified: Secondary | ICD-10-CM | POA: Insufficient documentation

## 2011-06-17 DIAGNOSIS — R188 Other ascites: Secondary | ICD-10-CM | POA: Insufficient documentation

## 2011-06-17 DIAGNOSIS — Z9889 Other specified postprocedural states: Secondary | ICD-10-CM | POA: Insufficient documentation

## 2011-06-17 DIAGNOSIS — R0602 Shortness of breath: Secondary | ICD-10-CM | POA: Insufficient documentation

## 2011-06-17 DIAGNOSIS — R000 Tachycardia, unspecified: Secondary | ICD-10-CM | POA: Insufficient documentation

## 2011-06-17 DIAGNOSIS — R109 Unspecified abdominal pain: Secondary | ICD-10-CM | POA: Insufficient documentation

## 2011-06-17 DIAGNOSIS — S36119A Unspecified injury of liver, initial encounter: Secondary | ICD-10-CM | POA: Insufficient documentation

## 2011-06-17 DIAGNOSIS — X58XXXA Exposure to other specified factors, initial encounter: Secondary | ICD-10-CM | POA: Insufficient documentation

## 2011-06-17 DIAGNOSIS — J9819 Other pulmonary collapse: Secondary | ICD-10-CM | POA: Insufficient documentation

## 2011-06-17 HISTORY — DX: Essential (primary) hypertension: I10

## 2011-06-17 LAB — COMPREHENSIVE METABOLIC PANEL
AST: 64 U/L — ABNORMAL HIGH (ref 0–37)
BUN: 7 mg/dL (ref 6–23)
CO2: 23 mEq/L (ref 19–32)
Calcium: 9.8 mg/dL (ref 8.4–10.5)
Chloride: 100 mEq/L (ref 96–112)
Creatinine, Ser: 0.76 mg/dL (ref 0.50–1.10)
GFR calc Af Amer: >60 mL/min (ref >60)
GFR calc non Af Amer: >60 mL/min (ref >60)
Glucose, Bld: 127 mg/dL — ABNORMAL HIGH (ref 70–99)
Total Bilirubin: 0.7 mg/dL (ref 0.3–1.2)

## 2011-06-17 LAB — CBC
Hemoglobin: 9.9 g/dL — ABNORMAL LOW (ref 12.0–15.0)
MCH: 28.7 pg (ref 26.0–34.0)
MCHC: 33.8 g/dL (ref 30.0–36.0)
MCV: 84.9 fL (ref 78.0–100.0)
RBC: 3.45 MIL/uL — ABNORMAL LOW (ref 3.87–5.11)

## 2011-06-17 LAB — DIFFERENTIAL
Basophils Relative: 0 % (ref 0–1)
Lymphs Abs: 4.4 10*3/uL — ABNORMAL HIGH (ref 0.7–4.0)
Monocytes Absolute: 2.2 10*3/uL — ABNORMAL HIGH (ref 0.1–1.0)
Monocytes Relative: 11 % (ref 3–12)
Neutro Abs: 13.9 10*3/uL — ABNORMAL HIGH (ref 1.7–7.7)
Neutrophils Relative %: 66 % (ref 43–77)

## 2011-06-17 LAB — LIPASE, BLOOD: Lipase: 65 U/L — ABNORMAL HIGH (ref 11–59)

## 2011-06-17 LAB — OCCULT BLOOD, POC DEVICE: Fecal Occult Bld: NEGATIVE

## 2011-06-18 ENCOUNTER — Encounter (HOSPITAL_COMMUNITY): Payer: Self-pay | Admitting: Radiology

## 2011-06-18 ENCOUNTER — Emergency Department (HOSPITAL_COMMUNITY): Payer: Medicare Other

## 2011-06-18 LAB — URINALYSIS, ROUTINE W REFLEX MICROSCOPIC
Bilirubin Urine: NEGATIVE
Ketones, ur: NEGATIVE mg/dL
Leukocytes, UA: NEGATIVE
Nitrite: NEGATIVE
Protein, ur: 30 mg/dL — AB
pH: 7 (ref 5.0–8.0)

## 2011-06-18 LAB — PRO B NATRIURETIC PEPTIDE: Pro B Natriuretic peptide (BNP): 66.3 pg/mL (ref 0–125)

## 2011-06-18 MED ORDER — IOHEXOL 300 MG/ML  SOLN
100.0000 mL | Freq: Once | INTRAMUSCULAR | Status: AC | PRN
  Administered 2011-06-18: 100 mL via INTRAVENOUS

## 2011-08-28 LAB — COMPREHENSIVE METABOLIC PANEL
ALT: 45 — ABNORMAL HIGH
AST: 68 — ABNORMAL HIGH
AST: 72 — ABNORMAL HIGH
Albumin: 3.2 — ABNORMAL LOW
Alkaline Phosphatase: 211 — ABNORMAL HIGH
Alkaline Phosphatase: 261 — ABNORMAL HIGH
CO2: 24
Chloride: 107
Chloride: 105
Creatinine, Ser: 0.88
GFR calc Af Amer: >60
GFR calc non Af Amer: 55 — ABNORMAL LOW
Glucose, Bld: 202 — ABNORMAL HIGH
Potassium: 3.7
Potassium: 4.2
Sodium: 136
Total Bilirubin: 0.9

## 2011-08-28 LAB — CBC
HCT: 39.3
HCT: 35.8 — ABNORMAL LOW
Hemoglobin: 13.2
Hemoglobin: 12.2
Hemoglobin: 12.7
MCHC: 32.6
MCHC: 33
MCHC: 33.3
MCV: 79.1
MCV: 79.2
Platelets: 21 — CL
Platelets: 52 — ABNORMAL LOW
RBC: 4.53
RBC: 4.66
RBC: 4.85
WBC: 5
WBC: 6.5
WBC: 13 — ABNORMAL HIGH
WBC: 8.7

## 2011-08-28 LAB — DIFFERENTIAL
Basophils Relative: 0
Eosinophils Relative: 4
Lymphocytes Relative: 22
Monocytes Relative: 12
Neutro Abs: 4

## 2011-08-28 LAB — URINALYSIS, ROUTINE W REFLEX MICROSCOPIC
Bilirubin Urine: NEGATIVE
Ketones, ur: NEGATIVE
Nitrite: POSITIVE — AB
pH: 8

## 2011-08-28 LAB — URINE MICROSCOPIC-ADD ON

## 2011-08-28 LAB — CROSSMATCH: Antibody Screen: NEGATIVE

## 2011-08-28 LAB — URINE CULTURE
Colony Count: 6000
Special Requests: NEGATIVE

## 2011-08-28 LAB — PREPARE PLATELET PHERESIS

## 2011-08-28 LAB — POCT I-STAT, CHEM 8
Calcium, Ion: 1.2
HCT: 42
TCO2: 24

## 2011-08-28 LAB — LACTATE DEHYDROGENASE: LDH: 149

## 2011-08-28 LAB — APTT: aPTT: 36

## 2011-08-28 LAB — IRON AND TIBC: Iron: 25 — ABNORMAL LOW

## 2012-01-07 ENCOUNTER — Encounter (HOSPITAL_COMMUNITY): Payer: Self-pay | Admitting: *Deleted

## 2012-01-07 ENCOUNTER — Emergency Department (HOSPITAL_COMMUNITY): Payer: Medicare Other

## 2012-01-07 ENCOUNTER — Inpatient Hospital Stay (HOSPITAL_COMMUNITY)
Admission: EM | Admit: 2012-01-07 | Discharge: 2012-01-13 | DRG: 438 | Disposition: A | Discharge status: Home / Self Care | Payer: Medicare Other | Attending: Internal Medicine | Admitting: Internal Medicine

## 2012-01-07 ENCOUNTER — Other Ambulatory Visit: Payer: Self-pay

## 2012-01-07 DIAGNOSIS — Z79899 Other long term (current) drug therapy: Secondary | ICD-10-CM

## 2012-01-07 DIAGNOSIS — I129 Hypertensive chronic kidney disease with stage 1 through stage 4 chronic kidney disease, or unspecified chronic kidney disease: Secondary | ICD-10-CM | POA: Diagnosis present

## 2012-01-07 DIAGNOSIS — J189 Pneumonia, unspecified organism: Secondary | ICD-10-CM | POA: Diagnosis present

## 2012-01-07 DIAGNOSIS — F329 Major depressive disorder, single episode, unspecified: Secondary | ICD-10-CM | POA: Diagnosis present

## 2012-01-07 DIAGNOSIS — N179 Acute kidney failure, unspecified: Secondary | ICD-10-CM | POA: Diagnosis not present

## 2012-01-07 DIAGNOSIS — Z87891 Personal history of nicotine dependence: Secondary | ICD-10-CM

## 2012-01-07 DIAGNOSIS — Z9089 Acquired absence of other organs: Secondary | ICD-10-CM

## 2012-01-07 DIAGNOSIS — Z9989 Dependence on other enabling machines and devices: Secondary | ICD-10-CM | POA: Diagnosis present

## 2012-01-07 DIAGNOSIS — J90 Pleural effusion, not elsewhere classified: Secondary | ICD-10-CM | POA: Diagnosis present

## 2012-01-07 DIAGNOSIS — N181 Chronic kidney disease, stage 1: Secondary | ICD-10-CM | POA: Diagnosis present

## 2012-01-07 DIAGNOSIS — Z6841 Body Mass Index (BMI) 40.0 and over, adult: Secondary | ICD-10-CM

## 2012-01-07 DIAGNOSIS — T380X5A Adverse effect of glucocorticoids and synthetic analogues, initial encounter: Secondary | ICD-10-CM | POA: Diagnosis not present

## 2012-01-07 DIAGNOSIS — R05 Cough: Secondary | ICD-10-CM

## 2012-01-07 DIAGNOSIS — K859 Acute pancreatitis without necrosis or infection, unspecified: Principal | ICD-10-CM | POA: Diagnosis present

## 2012-01-07 DIAGNOSIS — J9801 Acute bronchospasm: Secondary | ICD-10-CM | POA: Diagnosis present

## 2012-01-07 DIAGNOSIS — F3289 Other specified depressive episodes: Secondary | ICD-10-CM | POA: Diagnosis present

## 2012-01-07 DIAGNOSIS — E871 Hypo-osmolality and hyponatremia: Secondary | ICD-10-CM | POA: Diagnosis present

## 2012-01-07 DIAGNOSIS — R0609 Other forms of dyspnea: Secondary | ICD-10-CM

## 2012-01-07 DIAGNOSIS — R059 Cough, unspecified: Secondary | ICD-10-CM

## 2012-01-07 DIAGNOSIS — I498 Other specified cardiac arrhythmias: Secondary | ICD-10-CM | POA: Diagnosis present

## 2012-01-07 DIAGNOSIS — J45909 Unspecified asthma, uncomplicated: Secondary | ICD-10-CM | POA: Diagnosis present

## 2012-01-07 DIAGNOSIS — R0902 Hypoxemia: Secondary | ICD-10-CM | POA: Diagnosis present

## 2012-01-07 DIAGNOSIS — Z801 Family history of malignant neoplasm of trachea, bronchus and lung: Secondary | ICD-10-CM

## 2012-01-07 DIAGNOSIS — Z885 Allergy status to narcotic agent status: Secondary | ICD-10-CM

## 2012-01-07 DIAGNOSIS — D693 Immune thrombocytopenic purpura: Secondary | ICD-10-CM | POA: Diagnosis present

## 2012-01-07 DIAGNOSIS — R7309 Other abnormal glucose: Secondary | ICD-10-CM | POA: Diagnosis not present

## 2012-01-07 DIAGNOSIS — F32A Depression, unspecified: Secondary | ICD-10-CM | POA: Diagnosis present

## 2012-01-07 DIAGNOSIS — R197 Diarrhea, unspecified: Secondary | ICD-10-CM | POA: Diagnosis present

## 2012-01-07 DIAGNOSIS — I10 Essential (primary) hypertension: Secondary | ICD-10-CM | POA: Diagnosis present

## 2012-01-07 DIAGNOSIS — D869 Sarcoidosis, unspecified: Secondary | ICD-10-CM | POA: Diagnosis present

## 2012-01-07 DIAGNOSIS — R7989 Other specified abnormal findings of blood chemistry: Secondary | ICD-10-CM | POA: Diagnosis present

## 2012-01-07 DIAGNOSIS — M5020 Other cervical disc displacement, unspecified cervical region: Secondary | ICD-10-CM | POA: Diagnosis present

## 2012-01-07 DIAGNOSIS — G4733 Obstructive sleep apnea (adult) (pediatric): Secondary | ICD-10-CM | POA: Diagnosis present

## 2012-01-07 DIAGNOSIS — D72829 Elevated white blood cell count, unspecified: Secondary | ICD-10-CM | POA: Diagnosis present

## 2012-01-07 DIAGNOSIS — E8809 Other disorders of plasma-protein metabolism, not elsewhere classified: Secondary | ICD-10-CM | POA: Diagnosis present

## 2012-01-07 HISTORY — DX: Other cervical disc displacement, unspecified cervical region: M50.20

## 2012-01-07 HISTORY — DX: Personal history of pneumonia (recurrent): Z87.01

## 2012-01-07 HISTORY — DX: Depression, unspecified: F32.A

## 2012-01-07 HISTORY — DX: Major depressive disorder, single episode, unspecified: F32.9

## 2012-01-07 HISTORY — DX: Personal history of other diseases of the nervous system and sense organs: Z86.69

## 2012-01-07 LAB — URINALYSIS, ROUTINE W REFLEX MICROSCOPIC
Bilirubin Urine: NEGATIVE
Glucose, UA: NEGATIVE mg/dL
Hgb urine dipstick: NEGATIVE
Protein, ur: 100 mg/dL — AB
Specific Gravity, Urine: 1.02 (ref 1.005–1.030)

## 2012-01-07 LAB — COMPREHENSIVE METABOLIC PANEL
ALT: 45 U/L — ABNORMAL HIGH (ref 0–35)
AST: 70 U/L — ABNORMAL HIGH (ref 0–37)
Alkaline Phosphatase: 313 U/L — ABNORMAL HIGH (ref 39–117)
CO2: 22 mEq/L (ref 19–32)
Calcium: 9.1 mg/dL (ref 8.4–10.5)
Chloride: 99 mEq/L (ref 96–112)
GFR calc non Af Amer: >90 mL/min (ref >90)
Glucose, Bld: 124 mg/dL — ABNORMAL HIGH (ref 70–99)
Potassium: 3.6 mEq/L (ref 3.5–5.1)
Sodium: 133 mEq/L — ABNORMAL LOW (ref 135–145)

## 2012-01-07 LAB — DIFFERENTIAL
Basophils Absolute: 0 10*3/uL (ref 0.0–0.1)
Basophils Relative: 0 % (ref 0–1)
Eosinophils Relative: 0 % (ref 0–5)
Lymphocytes Relative: 19 % (ref 12–46)
Lymphs Abs: 4.7 10*3/uL — ABNORMAL HIGH (ref 0.7–4.0)
Monocytes Relative: 12 % (ref 3–12)
Neutro Abs: 17 10*3/uL — ABNORMAL HIGH (ref 1.7–7.7)

## 2012-01-07 LAB — CBC
HCT: 35.8 % — ABNORMAL LOW (ref 36.0–46.0)
Hemoglobin: 12.5 g/dL (ref 12.0–15.0)
MCV: 82.1 fL (ref 78.0–100.0)
RBC: 4.36 MIL/uL (ref 3.87–5.11)
WBC: 24.7 10*3/uL — ABNORMAL HIGH (ref 4.0–10.5)

## 2012-01-07 LAB — URINE MICROSCOPIC-ADD ON

## 2012-01-07 LAB — TROPONIN I: Troponin I: <0.3 ng/mL (ref <0.30)

## 2012-01-07 LAB — PREGNANCY, URINE: Preg Test, Ur: NEGATIVE

## 2012-01-07 MED ORDER — ONDANSETRON HCL 4 MG PO TABS: 4.0000 mg | ORAL_TABLET | Freq: Four times a day (QID) | ORAL | Status: DC | PRN

## 2012-01-07 MED ORDER — FENTANYL CITRATE 0.05 MG/ML IJ SOLN
50.0000 ug | Freq: Once | INTRAMUSCULAR | Status: AC
  Administered 2012-01-07: 50 ug via INTRAVENOUS
  Filled 2012-01-07: qty 2

## 2012-01-07 MED ORDER — VANCOMYCIN HCL IN DEXTROSE 1-5 GM/200ML-% IV SOLN
1000.0000 mg | Freq: Two times a day (BID) | INTRAVENOUS | Status: DC
  Administered 2012-01-07 – 2012-01-08 (×2): 1000 mg via INTRAVENOUS
  Filled 2012-01-07 (×4): qty 200

## 2012-01-07 MED ORDER — DEXTROSE 5 % IV SOLN
1.0000 g | INTRAVENOUS | Status: DC
  Administered 2012-01-07 – 2012-01-09 (×3): 1 g via INTRAVENOUS
  Filled 2012-01-07 (×3): qty 10

## 2012-01-07 MED ORDER — AMLODIPINE BESYLATE 5 MG PO TABS
5.0000 mg | ORAL_TABLET | Freq: Every day | ORAL | Status: DC
  Administered 2012-01-07: 5 mg via ORAL
  Filled 2012-01-07 (×2): qty 1

## 2012-01-07 MED ORDER — MORPHINE SULFATE 4 MG/ML IJ SOLN
4.0000 mg | Freq: Once | INTRAMUSCULAR | Status: AC
  Administered 2012-01-07: 4 mg via INTRAVENOUS
  Filled 2012-01-07: qty 1

## 2012-01-07 MED ORDER — VANCOMYCIN HCL 1000 MG IV SOLR
2000.0000 mg | INTRAVENOUS | Status: AC
  Administered 2012-01-07: 2000 mg via INTRAVENOUS
  Filled 2012-01-07: qty 2000

## 2012-01-07 MED ORDER — ALBUTEROL SULFATE (5 MG/ML) 0.5% IN NEBU
INHALATION_SOLUTION | RESPIRATORY_TRACT | Status: AC
  Filled 2012-01-07: qty 1

## 2012-01-07 MED ORDER — SODIUM CHLORIDE 0.9 % IV SOLN
INTRAVENOUS | Status: DC
  Administered 2012-01-07 – 2012-01-08 (×3): via INTRAVENOUS

## 2012-01-07 MED ORDER — HYDROMORPHONE HCL PF 1 MG/ML IJ SOLN
2.0000 mg | INTRAMUSCULAR | Status: DC | PRN
  Administered 2012-01-07 – 2012-01-10 (×7): 2 mg via INTRAVENOUS
  Filled 2012-01-07: qty 2
  Filled 2012-01-07: qty 1
  Filled 2012-01-07: qty 2
  Filled 2012-01-07 (×2): qty 1
  Filled 2012-01-07 (×2): qty 2
  Filled 2012-01-07 (×3): qty 1

## 2012-01-07 MED ORDER — IOHEXOL 300 MG/ML  SOLN
100.0000 mL | Freq: Once | INTRAMUSCULAR | Status: AC | PRN
  Administered 2012-01-07: 100 mL via INTRAVENOUS

## 2012-01-07 MED ORDER — ONDANSETRON HCL 4 MG/2ML IJ SOLN: 4.0000 mg | Freq: Four times a day (QID) | INTRAMUSCULAR | Status: DC | PRN

## 2012-01-07 MED ORDER — DEXTROSE 5 % IV SOLN
500.0000 mg | INTRAVENOUS | Status: DC
  Administered 2012-01-07 – 2012-01-09 (×3): 500 mg via INTRAVENOUS
  Filled 2012-01-07 (×3): qty 500

## 2012-01-07 MED ORDER — FENTANYL CITRATE 0.05 MG/ML IJ SOLN
50.0000 ug | Freq: Once | INTRAMUSCULAR | Status: AC
  Administered 2012-01-07: 07:00:00 via INTRAVENOUS
  Filled 2012-01-07: qty 2

## 2012-01-07 MED ORDER — IPRATROPIUM BROMIDE 0.02 % IN SOLN
0.5000 mg | Freq: Once | RESPIRATORY_TRACT | Status: AC
  Administered 2012-01-07: 0.5 mg via RESPIRATORY_TRACT
  Filled 2012-01-07: qty 2.5

## 2012-01-07 MED ORDER — ALBUTEROL SULFATE (5 MG/ML) 0.5% IN NEBU
5.0000 mg | INHALATION_SOLUTION | Freq: Once | RESPIRATORY_TRACT | Status: AC
  Administered 2012-01-07: 06:00:00 via RESPIRATORY_TRACT
  Filled 2012-01-07: qty 1

## 2012-01-07 MED ORDER — SODIUM CHLORIDE 0.9 % IV BOLUS (SEPSIS)
500.0000 mL | Freq: Once | INTRAVENOUS | Status: AC
  Administered 2012-01-07: 500 mL via INTRAVENOUS

## 2012-01-07 NOTE — H&P (Signed)
Hospital Admission Note Date: 01/07/2012  Patient name: [name] Medical record number: [id number] Date of birth: [birth year] Age: 46 y.o. Gender: female PCP: Elpidio Anis, PA, PA  Attending physician: Geoffery Lyons, MD Emergency Contact: Jeralene Huff (husband) [phone] Code Status:  Full  Chief Complaint: "Abdominal pain"  History of Present Illness: [name] is an 46 y.o. female with PMH of sarcoidosis and ITP s/p splenectomy who presents to the ER with the sudden onset of abdominal pain which began about 4 hours ago.  Pain is in the LUQ and radiates up into chest and back.  Pain is sharp in quality, rated 10/10.  No history of similar pain.  Coughing aggravates symptoms, pain medication, given in the ER, have eased it off some.  The patient also reports recent diarrhea, last episode yesterday, but no melena or hematochezia.  She states she had a recent stomach virus.  No associated nausea or vomiting.  Went to [facility] yesterday with complaints of sore throat and diarrhea.  States family has been sick with similar symptoms.   Reports that she had a flu vaccination this season.  Upon initial evaluation in the ER, the patient was found to have an elevated lipase and elevated LFTs and was subsequently referred to the hospitalist service for further evaluation and treatment.  Past Medical History Past Medical History  Diagnosis Date  . Sarcoidosis   . ITP (idiopathic thrombocytopenic purpura)     Dr. Shirline Frees  . Hypertension   . Asthma   . Depression   . H/O Bell's palsy   . H/O: pneumonia   . Herniated disc, cervical     Past Surgical History Past Surgical History  Procedure Date  . Tubal ligation   . Cholecystectomy   . 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 Cesarian section     x2  . Percutaneous liver biopsy     x2  . Left stapedectomy   . Bone marrow biopsy     12/2001    Meds: Prior to Admission medications   Medication Sig Start Date End Date Taking? Authorizing  Provider  albuterol (ACCUNEB) 0.63 MG/3ML nebulizer solution Take 1 ampule by nebulization every 6 (six) hours as needed.     Yes Historical Provider, MD  citalopram (CELEXA) 20 MG tablet Take 20 mg by mouth daily.   Yes Historical Provider, MD    Allergies: Avelox and Codeine  Social History: History   Social History  . Marital Status: Married    Spouse Name: Meda Coffee    Number of Children: 2  . Years of Education: 17   Occupational History  . Disabled, worked in Clinical biochemist   . Student in public administration    Social History Main Topics  . Smoking status: Former Smoker -- 0.5 packs/day for 5 years    Types: Cigarettes  . Smokeless tobacco: Never Used  . Alcohol Use: No  . Drug Use: No  . Sexually Active: Not on file   Other Topics Concern  . Not on file   Social History Narrative   Married, lives in [place] with husband.  Independent of ADLs and ambulation.    Family History:  Family History  Problem Relation Age of Onset  . Adopted: Yes    Review of Systems: Constitutional: + fever and chills x 24 hours;  Appetite diminished; No weight loss.  HEENT: No blurry vision or diplopia, + pharyngitis (neg strep yesterday), no dysphagia CV: No chest pain or arrhythmia.  Resp: +  SOB, + cough productive of green blood flecked sputum. GI: No N/V, + diarrhea, no melena or hematochezia.  GU: No dysuria or hematuria.  MSK: no myalgias/arthralgias.  Neuro:  No headache or focal neurological deficits.  Psych: + depression, no anxiety.  Endo: No thyroid disease or DM.  Skin: No rashes or lesions.  Heme: No anemia or blood dyscrasia   Physical Exam: Blood pressure 160/83, pulse 64, temperature 97.6 F (36.4 C), temperature source Oral, resp. rate 19, height 5\' 5"  (1.651 m), weight 118.389 kg (261 lb), last menstrual period 12/02/2011, SpO2 100.00%. BP 160/83  Pulse 64  Temp(Src) 97.6 F (36.4 C) (Oral)  Resp 19  Ht 5\' 5"  (1.651 m)  Wt 118.389 kg (261 lb)  BMI 43.43  kg/m2  SpO2 100%  LMP 12/02/2011  General Appearance:    Alert, cooperative, no distress, appears stated age  Head:    Normocephalic, without obvious abnormality, atraumatic  Eyes:    PERRL, conjunctiva/corneas clear, EOM's intact  Ears:    Normal external ear canals, both ears  Nose:   Nares normal, septum midline, mucosa normal, no drainage    or sinus tenderness  Throat:   Lips, mucosa, and tongue normal; teeth and gums normal  Neck:   Supple, symmetrical, trachea midline, no adenopathy;    thyroid:  no enlargement/tenderness/nodules; no carotid   bruit or JVD  Back:     Symmetric, no curvature, ROM normal, no CVA tenderness  Lungs:     Clear to auscultation bilaterally, respirations unlabored  Chest Wall:    No tenderness or deformity   Heart:    Tachycardic rate, regular rhythm, S1 and S2 normal, no murmur, rub   or gallop  Abdomen:     Soft, tender LUQ, bowel sounds active all four quadrants,    no masses, no organomegaly  Extremities:   Extremities normal, atraumatic, no cyanosis or edema  Pulses:   2+ and symmetric all extremities  Skin:   Skin color, texture, turgor normal, sarcoid lesions BLE  Neurologic:   Non-focal   Lab results: Basic Metabolic Panel:  Lab 01/07/12 1610  NA 133*  K 3.6  CL 99  CO2 22  GLUCOSE 124*  BUN 10  CREATININE 0.74  CALCIUM 9.1  MG --  PHOS --   GFR Estimated Creatinine Clearance: 112 ml/min (by C-G formula based on Cr of 0.74). Liver Function Tests:  Lab 01/07/12 0650  AST 70*  ALT 45*  ALKPHOS 313*  BILITOT 0.6  PROT 9.1*  ALBUMIN 3.2*    Lab 01/07/12 0650  LIPASE 78*  AMYLASE --    CBC:  Lab 01/07/12 0650  WBC 24.7*  NEUTROABS 17.0*  HGB 12.5  HCT 35.8*  MCV 82.1  PLT 199   Cardiac Enzymes:  Lab 01/07/12 0650  CKTOTAL --  CKMB --  CKMBINDEX --  TROPONINI <0.30    Imaging results:  Dg Abd Acute W/chest  01/07/2012  *RADIOLOGY REPORT*  Clinical Data: Left chest and abdomen pain, some shortness of breath   ACUTE ABDOMEN SERIES (ABDOMEN 2 VIEW & CHEST 1 VIEW)  Comparison: Chest and acute abdomen of 06/17/2011 and CT abdomen pelvis of 06/18/2011  Findings:  The lungs again are not optimally aerated.  There are prominent interstitial markings bilaterally possibly chronic in nature, but accentuated by poor inspiration.  Superimposed pneumonia or edema cannot be excluded.  No definite effusion is seen.  Heart size is stable.  Supine and erect views of the abdomen show both  large and small bowel gas to be present.  No bowel obstruction is seen.  No free air is noted.  Surgical clips are present in the right upper quadrant from prior cholecystectomy. No opaque calculi are seen.  IMPRESSION:  1.  Diffusely prominent interstitial markings.  Possibly chronic and accentuated by poor inspiration, but superimposed pneumonia or less likely edema cannot be excluded. 2.  No bowel obstruction.  No free air.  Original Report Authenticated By: Juline Patch, M.D.    Assessment & Plan: Principal Problem:  *Pancreatitis Admit the patient and place her on bowel rest. Provide symptomatic relief with when necessary pain and nausea medicines. The patient is status post cholecystectomy and has no history of alcohol abuse so therefore will obtain a CT scan of the abdomen and pelvis to help identify the cause of her pancreatitis. Active Problems:  SARCOIDOSIS (ANY SITE) Patient has chronic sarcoidosis. She is not currently on any steroid therapy for this.  Elevated LFTs Patient's elevated LFTs are somewhat chronic. She has had 2 known liver biopsies. Pathology is not currently available for review. Again, we'll obtain a CT scan of the abdomen and pelvis to evaluate further.  Cough / Leukocytosis / CAP in a patient with sarcoidosis and h/o splenectomy The patient has a known history of sarcoidosis and chest radiographs concerning for possible pneumonia. Will obtain sputum cultures and start the patient on Rocephin, Vancomycin and  azithromycin. The patient does have chronic leukocytosis with her white blood cell count ranging from 13-21 which is likely due to her history of splenectomy. Because of her history of splenectomy, she will be treated with broad-spectrum antibiotics as noted above which include coverage for penicillin-resistant pneumococcal infection and beta-lactamase producing H. influenzae.  Hyperproteinemia / Hypoalbuminemia Patient has a history of high serum protein with a low to normal albumin level. We'll need to rule out hyper paraproteinemia. We'll obtain an SPEP and a UPEP.  Hypertension The patient is not currently on medications to treat hypertension. Her systolic blood pressures 160 and therefore I will start her on Norvasc.  Depression The patient is on citalopram. Will hold for now.  Hyponatremia Likely secondary to dehydration. We'll gently hydrate and monitor her electrolytes closely.  Prophylaxis: PAS hoses for DVT prophylaxis have been ordered.  Time Spent On Admission: 1 hour  Lillianne Eick 01/07/2012, 10:05 AM Pager [phone]

## 2012-01-07 NOTE — ED Notes (Signed)
Patient transported to X-ray 

## 2012-01-07 NOTE — ED Notes (Signed)
Patient reports sudden onset LUQ ABD pain that began at approx. 0500 this AM, worsens with movement and deep inspiration/expiration. Also reports progressive shortness of breath d/t the pain.

## 2012-01-07 NOTE — ED Notes (Signed)
Patient transported to CT 

## 2012-01-07 NOTE — ED Provider Notes (Signed)
History     CSN: [id number]  Arrival date & time 01/07/12  0617   First MD Initiated Contact with Patient 01/07/12 [phone]      Chief Complaint  Patient presents with  . Shortness of Breath  . Abdominal Pain    LUQ    (Consider location/radiation/quality/duration/timing/severity/associated sxs/prior treatment) Patient is a 46 y.o. female presenting with shortness of breath and abdominal pain. The history is provided by the patient.  Shortness of Breath  The current episode started today. Associated symptoms include shortness of breath. Pertinent negatives include no chest pain.  Abdominal Pain The primary symptoms of the illness include abdominal pain and shortness of breath. The primary symptoms of the illness do not include nausea, vomiting or diarrhea.  Symptoms associated with the illness do not include back pain.   patient developed a shortness of breath and left upper quadrant abdominal pain. She room air sats of 91%. She is a history of sarcoid. No nausea vomiting diarrhea. No constipation. Occasional cough. No fevers.  Past Medical History  Diagnosis Date  . Sarcoidosis   . ITP (idiopathic thrombocytopenic purpura)     Dr. Shirline Frees  . Hypertension     Past Surgical History  Procedure Date  . Tubal ligation     Family History  Problem Relation Age of Onset  . Adopted: Yes    History  Substance Use Topics  . Smoking status: Not on file  . Smokeless tobacco: Not on file  . Alcohol Use: Not on file    OB History    Grav Para Term Preterm Abortions TAB SAB Ect Mult Living                  Review of Systems  Constitutional: Negative for activity change and appetite change.  HENT: Negative for neck stiffness.   Eyes: Negative for pain.  Respiratory: Positive for shortness of breath. Negative for chest tightness.   Cardiovascular: Negative for chest pain and leg swelling.  Gastrointestinal: Positive for abdominal pain. Negative for nausea, vomiting and  diarrhea.  Genitourinary: Negative for flank pain.  Musculoskeletal: Negative for back pain.  Skin: Negative for rash.  Neurological: Negative for weakness, numbness and headaches.  Psychiatric/Behavioral: Negative for behavioral problems.    Allergies  Avelox and Codeine  Home Medications   Current Outpatient Rx  Name Route Sig Dispense Refill  . ALBUTEROL SULFATE 0.63 MG/3ML IN NEBU Nebulization Take 1 ampule by nebulization every 6 (six) hours as needed.      Marland Kitchen CITALOPRAM HYDROBROMIDE 20 MG PO TABS Oral Take 20 mg by mouth daily.      BP 105/50  Pulse 100  Temp(Src) 97.6 F (36.4 C) (Oral)  Resp 19  Ht 5\' 5"  (1.651 m)  Wt 261 lb (118.389 kg)  BMI 43.43 kg/m2  SpO2 95%  LMP 12/02/2011  Physical Exam  Nursing note and vitals reviewed. Constitutional: She is oriented to person, place, and time. She appears well-developed and well-nourished.  HENT:  Head: Normocephalic and atraumatic.  Eyes: EOM are normal. Pupils are equal, round, and reactive to light.  Neck: Normal range of motion. Neck supple.  Cardiovascular: Normal rate, regular rhythm and normal heart sounds.   No murmur heard. Pulmonary/Chest: Effort normal. No respiratory distress. She has wheezes. She has no rales.       Mildly harsh breath sounds throughout  Abdominal: Soft. Bowel sounds are normal. She exhibits no distension. There is tenderness. There is no rebound and no guarding.  Moderate tenderness left upper abdomen.  Musculoskeletal: Normal range of motion.  Neurological: She is alert and oriented to person, place, and time. No cranial nerve deficit.  Skin: Skin is warm and dry.  Psychiatric: She has a normal mood and affect. Her speech is normal.    ED Course  Procedures (including critical care time)  Labs Reviewed  CBC - Abnormal; Notable for the following:    WBC 24.7 (*)    HCT 35.8 (*)    RDW 16.4 (*)    All other components within normal limits  DIFFERENTIAL - Abnormal; Notable  for the following:    Neutro Abs 17.0 (*)    Lymphs Abs 4.7 (*)    Monocytes Absolute 3.0 (*)    All other components within normal limits  COMPREHENSIVE METABOLIC PANEL - Abnormal; Notable for the following:    Sodium 133 (*)    Glucose, Bld 124 (*)    Total Protein 9.1 (*)    Albumin 3.2 (*)    AST 70 (*)    ALT 45 (*)    Alkaline Phosphatase 313 (*)    All other components within normal limits  LIPASE, BLOOD - Abnormal; Notable for the following:    Lipase 78 (*)    All other components within normal limits  URINALYSIS, ROUTINE W REFLEX MICROSCOPIC - Abnormal; Notable for the following:    APPearance CLOUDY (*)    Protein, ur 100 (*)    All other components within normal limits  URINE MICROSCOPIC-ADD ON - Abnormal; Notable for the following:    Bacteria, UA FEW (*)    Casts HYALINE CASTS (*)    All other components within normal limits  PREGNANCY, URINE  TROPONIN I   Dg Abd Acute W/chest  01/07/2012  *RADIOLOGY REPORT*  Clinical Data: Left chest and abdomen pain, some shortness of breath  ACUTE ABDOMEN SERIES (ABDOMEN 2 VIEW & CHEST 1 VIEW)  Comparison: Chest and acute abdomen of 06/17/2011 and CT abdomen pelvis of 06/18/2011  Findings:  The lungs again are not optimally aerated.  There are prominent interstitial markings bilaterally possibly chronic in nature, but accentuated by poor inspiration.  Superimposed pneumonia or edema cannot be excluded.  No definite effusion is seen.  Heart size is stable.  Supine and erect views of the abdomen show both large and small bowel gas to be present.  No bowel obstruction is seen.  No free air is noted.  Surgical clips are present in the right upper quadrant from prior cholecystectomy. No opaque calculi are seen.  IMPRESSION:  1.  Diffusely prominent interstitial markings.  Possibly chronic and accentuated by poor inspiration, but superimposed pneumonia or less likely edema cannot be excluded. 2.  No bowel obstruction.  No free air.  Original  Report Authenticated By: Juline Patch, M.D.     1. Chest pain   2. Abdominal pain   3. Leukocytosis   4. SARCOIDOSIS (ANY SITE)      Date: 01/07/2012  Rate: 103  Rhythm: sinus tachycardia  QRS Axis: normal  Intervals: normal  ST/T Wave abnormalities: normal  Conduction Disutrbances:nonspecific intraventricular conduction delay  Narrative Interpretation: Mild tachycardia  Old EKG Reviewed: changes noted    MDM   acute onset chest pain and abdominal pain. Mild hypoxia on arrival. Continued pain. She's elevated white cell count. This could be due to her previous splenectomy. She also has LFT abnormalities and elevated lipase, these are both chronic for her. Oxygen levels improved on nasal cannula.  EKG is reassuring. She'll be admitted to medicine.        Juliet Rude. Rubin Payor, MD 01/07/12 [phone]

## 2012-01-07 NOTE — ED Notes (Signed)
Patient brought in by EMS from home with c/o sudden onset shortness of breath and LUQ ABD pain. Patient received Albuterol 5mg  INH en route with improvement in symptoms. Hx of asthma; OSA; hypertension; sarcoidosis. Per EMS, EKG en route was unremarkable; arrives with 18G in place to left wrist.

## 2012-01-07 NOTE — Progress Notes (Signed)
ANTIBIOTIC CONSULT NOTE - INITIAL  Pharmacy Consult for Vancomycin Indication: rule out pneumonia  Allergies  Allergen Reactions  . Avelox (Moxifloxacin Hcl In Nacl)   . Codeine     Patient Measurements: Height: 5\' 5"  (165.1 cm) Weight: 261 lb (118.389 kg) IBW/kg (Calculated) : 57 kg  Vital Signs: Temp: 97.6 F (36.4 C) (02/06 0626) Temp src: Oral (02/06 0626) BP: 160/83 mmHg (02/06 0900) Pulse Rate: 64  (02/06 0900)    Labs:  Basename 01/07/12 0650  WBC 24.7*  HGB 12.5  PLT 199  LABCREA --  CREATININE 0.74   Estimated Creatinine Clearance: 112 ml/min (by C-G formula based on Cr of 0.74).   Microbiology: No results found for this or any previous visit (from the past 720 hour(s)).  Medical History: Past Medical History  Diagnosis Date  . Sarcoidosis   . ITP (idiopathic thrombocytopenic purpura)     Dr. Shirline Frees  . Hypertension   . Asthma   . Depression   . H/O Bell's palsy   . H/O: pneumonia   . Herniated disc, cervical     Medications:  Scheduled:    . albuterol  5 mg Nebulization Once  . amLODipine  5 mg Oral Daily  . azithromycin  500 mg Intravenous Q24H  . cefTRIAXone (ROCEPHIN)  IV  1 g Intravenous Q24H  . fentaNYL  50 mcg Intravenous Once  . fentaNYL  50 mcg Intravenous Once  . fentaNYL  50 mcg Intravenous Once  . ipratropium  0.5 mg Nebulization Once  .  morphine injection  4 mg Intravenous Once  . sodium chloride  500 mL Intravenous Once   Assessment: 46 yo female adm with abdominal pain, fever and chills x 24 hrs. Pt s/p cholecystectomy and CT scan of the abdomen and pelvis for further evaluation. Vancomycin, Rocephin and Azithromycin started for CAP/leukocytosis in patient with sarcoidosis and h/o splenectomy.  Goal of Therapy:  Vancomycin trough level 15-20 mcg/ml  Plan:  1. Vancomycin 2 Gm IV x 1 then 1 Gm IV Q12h 2. Will check Vanco trough level at steady state or if renal function changes.   Dorethea Clan 01/07/2012,10:35  AM

## 2012-01-07 NOTE — ED Notes (Signed)
[id number]> Expected date:<BR> Expected time:<BR> Means of arrival:<BR> Comments:<BR> EMS/asthma/wheezing/tachycardia/hypertensive

## 2012-01-08 DIAGNOSIS — Z9989 Dependence on other enabling machines and devices: Secondary | ICD-10-CM | POA: Diagnosis present

## 2012-01-08 DIAGNOSIS — J9801 Acute bronchospasm: Secondary | ICD-10-CM | POA: Diagnosis present

## 2012-01-08 DIAGNOSIS — G4733 Obstructive sleep apnea (adult) (pediatric): Secondary | ICD-10-CM | POA: Diagnosis present

## 2012-01-08 MED ORDER — GUAIFENESIN ER 600 MG PO TB12
600.0000 mg | ORAL_TABLET | Freq: Two times a day (BID) | ORAL | Status: DC
  Administered 2012-01-08 – 2012-01-13 (×11): 600 mg via ORAL
  Filled 2012-01-08 (×12): qty 1

## 2012-01-08 MED ORDER — POTASSIUM CHLORIDE IN NACL 20-0.9 MEQ/L-% IV SOLN
INTRAVENOUS | Status: DC
  Administered 2012-01-08 – 2012-01-09 (×3): via INTRAVENOUS
  Filled 2012-01-08 (×7): qty 1000

## 2012-01-08 MED ORDER — ALBUTEROL SULFATE (5 MG/ML) 0.5% IN NEBU: 2.5000 mg | INHALATION_SOLUTION | Freq: Four times a day (QID) | RESPIRATORY_TRACT | Status: DC | PRN

## 2012-01-08 MED ORDER — METHYLPREDNISOLONE SODIUM SUCC 125 MG IJ SOLR
80.0000 mg | Freq: Three times a day (TID) | INTRAMUSCULAR | Status: DC
  Administered 2012-01-08 – 2012-01-11 (×9): 80 mg via INTRAVENOUS
  Filled 2012-01-08 (×12): qty 1.28

## 2012-01-08 NOTE — Progress Notes (Signed)
PATIENT DETAILS Name: [name] Age: 46 y.o. Sex: female Date of Birth: 1966-03-03 Admit Date: 01/07/2012 QMV:HQIO, Denny Peon, PA, PA Emergency contact:   CONSULTS: None  Interval History: Mrs. [name] is a 46 year old female admitted on 01/07/12 with acute pancreatitis and CAP.  ROS: Mrs. [name] is still short of breath, and now has a wheeze.  She still has significant LUQ pain.  She still has a cough.  No N/V.   Objective: Vital signs in last 24 hours: Temp:  [98.4 F (36.9 C)-99.6 F (37.6 C)] 98.4 F (36.9 C) (02/07 0531) Pulse Rate:  [87-103] 87  (02/07 0531) Resp:  [16-24] 16  (02/07 0531) BP: (98-125)/(54-78) 98/54 mmHg (02/07 0531) SpO2:  [93 %-98 %] 93 % (02/07 0531) Weight:  [120.203 kg (265 lb)] 120.203 kg (265 lb) (02/06 1845) Weight change: 1.814 kg (4 lb) Last BM Date: 01/06/12  Intake/Output from previous day:  Intake/Output Summary (Last 24 hours) at 01/08/12 1127 Last data filed at 01/08/12 0500  Gross per 24 hour  Intake 2497.92 ml  Output      0 ml  Net 2497.92 ml     Physical Exam:  Gen:  NAD Cardiovascular:  RRR, No M/R/G Respiratory: Lungs course with bilateral wheeze Gastrointestinal: Abdomen soft, NT/ND with normal active bowel sounds. Extremities: No C/E/C     Lab Results: Basic Metabolic Panel:  Lab 01/07/12 9629  NA 133*  K 3.6  CL 99  CO2 22  GLUCOSE 124*  BUN 10  CREATININE 0.74  CALCIUM 9.1  MG --  PHOS --   GFR Estimated Creatinine Clearance: 112.9 ml/min (by C-G formula based on Cr of 0.74). Liver Function Tests:  Lab 01/07/12 0650  AST 70*  ALT 45*  ALKPHOS 313*  BILITOT 0.6  PROT 9.1*  ALBUMIN 3.2*    Lab 01/07/12 0650  LIPASE 78*  AMYLASE --    CBC:  Lab 01/07/12 0650  WBC 24.7*  NEUTROABS 17.0*  HGB 12.5  HCT 35.8*  MCV 82.1  PLT 199   Cardiac Enzymes:  Lab 01/07/12 0650  CKTOTAL --  CKMB --  CKMBINDEX --  TROPONINI <0.30   Microbiology Recent Results (from the past 240 hour(s))    CULTURE, RESPIRATORY     Status: Normal (Preliminary result)   Collection Time   01/07/12 11:10 AM      Component Value Range Status Comment   Specimen Description SPUTUM   Final    Special Requests NONE   Final    Gram Stain     Final    Value: FEW WBC PRESENT,BOTH PMN AND MONONUCLEAR     RARE SQUAMOUS EPITHELIAL CELLS PRESENT     RARE GRAM POSITIVE COCCI     IN PAIRS RARE GRAM POSITIVE RODS     RARE GRAM NEGATIVE RODS   Culture NORMAL OROPHARYNGEAL FLORA   Final    Report Status PENDING   Incomplete     Recent Results (from the past 240 hour(s))  CULTURE, RESPIRATORY     Status: Normal (Preliminary result)   Collection Time   01/07/12 11:10 AM      Component Value Range Status Comment   Specimen Description SPUTUM   Final    Special Requests NONE   Final    Gram Stain     Final    Value: FEW WBC PRESENT,BOTH PMN AND MONONUCLEAR     RARE SQUAMOUS EPITHELIAL CELLS PRESENT     RARE GRAM POSITIVE COCCI  IN PAIRS RARE GRAM POSITIVE RODS     RARE GRAM NEGATIVE RODS   Culture NORMAL OROPHARYNGEAL FLORA   Final    Report Status PENDING   Incomplete     Studies/Results: Ct Abdomen Pelvis W Contrast  01/07/2012   IMPRESSION:  1.  Again noted nodular contour of the liver probable due to cirrhosis.  There is a fluid density lesion anterior aspect of the left hepatic lobe superficially.  This may represent a subcapsular cyst, residual seroma in the site of previous abdominal hematoma or small loculated ascites.  No enhancing hepatic lesion is noted.  No intrahepatic biliary ductal dilatation.  2.  Status post cholecystectomy. 3.  Status post splenectomy. 4.  No hydronephrosis or hydroureter.  No small bowel obstruction. 5.  Normal appendix is clearly visualized. 6.  There is a small bilateral pleural effusion.  Left base posteriorly atelectasis or infiltrate.  Original Report Authenticated By: Natasha Mead, M.D.   Dg Abd Acute W/chest  01/07/2012   IMPRESSION:  1.  Diffusely prominent  interstitial markings.  Possibly chronic and accentuated by poor inspiration, but superimposed pneumonia or less likely edema cannot be excluded. 2.  No bowel obstruction.  No free air.  Original Report Authenticated By: Juline Patch, M.D.    Medications: Scheduled Meds:   . amLODipine  5 mg Oral Daily  . azithromycin  500 mg Intravenous Q24H  . cefTRIAXone (ROCEPHIN)  IV  1 g Intravenous Q24H  . vancomycin  2,000 mg Intravenous To ER  . vancomycin  1,000 mg Intravenous Q12H   Continuous Infusions:   . sodium chloride 125 mL/hr at 01/08/12 0212   PRN Meds:.HYDROmorphone, iohexol, ondansetron (ZOFRAN) IV, ondansetron Antibiotics: Anti-infectives     Start     Dose/Rate Route Frequency Ordered Stop   01/07/12 2300   vancomycin (VANCOCIN) IVPB 1000 mg/200 mL premix        1,000 mg 200 mL/hr over 60 Minutes Intravenous Every 12 hours 01/07/12 1048     01/07/12 1130   vancomycin (VANCOCIN) 2,000 mg in sodium chloride 0.9 % 500 mL IVPB        2,000 mg 250 mL/hr over 120 Minutes Intravenous To Emergency Dept 01/07/12 1047 01/07/12 1754   01/07/12 1015   cefTRIAXone (ROCEPHIN) 1 g in dextrose 5 % 50 mL IVPB        1 g 100 mL/hr over 30 Minutes Intravenous Every 24 hours 01/07/12 1010     01/07/12 1015   azithromycin (ZITHROMAX) 500 mg in dextrose 5 % 250 mL IVPB        500 mg 250 mL/hr over 60 Minutes Intravenous Every 24 hours 01/07/12 1010             Assessment/Plan:  Principal Problem:  *Pancreatitis  The patient has been on bowel rest. We are providing symptomatic relief with when necessary pain and nausea medicines. The patient is status post cholecystectomy and has no history of alcohol abuse so we obtained a CT scan of the abdomen and pelvis to help identify the cause of her pancreatitis, which was unrevelaing. Continue bowel rest.  Check lipase in a.m., and if normalized, start to advance diet. Active Problems:  SARCOIDOSIS (ANY SITE) / Bronchospasm. Patient has  chronic sarcoidosis. She is not currently on any steroid therapy for this. She is now with bronchospasm, so will start on Solumedrol and taper as tolerated. Elevated LFTs / ? Cirrhosis Patient's elevated LFTs are somewhat chronic. She has had 2 known liver  biopsies. Pathology is not currently available for review, but patient reports that they showed sarcoid not cirrhosis.  No dilated ducts noted on CT. Cough / Leukocytosis / CAP in a patient with sarcoidosis and h/o splenectomy  The patient has a known history of sarcoidosis and chest radiographs concerning for possible pneumonia. We started the patient on Rocephin, Vancomycin and azithromycin. The patient does have chronic leukocytosis with her white blood cell count ranging from 13-21 which is likely due to her history of splenectomy. Because of her history of splenectomy, she was treated with broad-spectrum antibiotics as noted above which includes coverage for penicillin-resistant pneumococcal infection and beta-lactamase producing H. influenzae. Respiratory cultures grew normal oropharyngeal flora, so will d/c vancomycin. Hyperproteinemia / Hypoalbuminemia  Patient has a history of high serum protein with a low to normal albumin level. We'll need to rule out hyper paraproteinemia. F/U SPEP and UPEP.  Hypertension  The patient was not on medications to treat hypertension PTA. Her systolic blood pressures were 161, so we started her on Norvasc.  BP now on the low side.  I suspect HTN was due to pain, so will d/c Norvasc.  Depression  The patient is on citalopram. Will hold for now.  Hyponatremia  Likely secondary to dehydration. We'll gently hydrate and monitor her electrolytes closely.  OSA on CPAP Continue nocturnal CPAP.    LOS: 1 day   Hillery Aldo, MD Pager [phone]  01/08/2012, 11:27 AM

## 2012-01-08 NOTE — Progress Notes (Signed)
Assisted pt with her cpap and full face mask from home, 2lo2 bleedin. Pt tolerating well at this time. RN aware.

## 2012-01-08 NOTE — Progress Notes (Signed)
CARE MANAGEMENT NOTE 01/08/2012  Patient:  [name],[name]   Account Number:  [id number]  Date Initiated:  01/08/2012  Documentation initiated by:  DAVIS,RHONDA  Subjective/Objective Assessment:   pt with confirmed pancreatitis, abd pain,elevated lipase, nasuea and diarreha     Action/Plan:   lives at home with spouse   Anticipated DC Date:  01/11/2012   Anticipated DC Plan:  HOME/SELF CARE  In-house referral  NA      DC Planning Services  NA      [facility] Choice  NA   Choice offered to / List presented to:  NA   DME arranged  NA      DME agency  NA     HH arranged  NA      HH agency  NA   Status of service:  In process, will continue to follow Medicare Important Message given?  NO (If response is "NO", the following Medicare IM given date fields will be blank) Date Medicare IM given:   Date Additional Medicare IM given:    Discharge Disposition:    Per UR Regulation:  Reviewed for med. necessity/level of care/duration of stay  Comments:  [id number]/Rhonda Davis,Rn,BSN,CCM

## 2012-01-09 LAB — UIFE/LIGHT CHAINS/TP QN, 24-HR UR
Alpha 2, Urine: DETECTED — AB
Beta, Urine: DETECTED — AB
Free Kappa Lt Chains,Ur: 10.4 mg/dL — ABNORMAL HIGH (ref 0.14–2.42)
Gamma Globulin, Urine: DETECTED — AB

## 2012-01-09 LAB — COMPREHENSIVE METABOLIC PANEL
ALT: 33 U/L (ref 0–35)
Albumin: 2.3 g/dL — ABNORMAL LOW (ref 3.5–5.2)
Alkaline Phosphatase: 269 U/L — ABNORMAL HIGH (ref 39–117)
Potassium: 4.3 mEq/L (ref 3.5–5.1)
Sodium: 134 mEq/L — ABNORMAL LOW (ref 135–145)
Total Protein: 7.8 g/dL (ref 6.0–8.3)

## 2012-01-09 LAB — CBC
HCT: 33.4 % — ABNORMAL LOW (ref 36.0–46.0)
MCH: 28.6 pg (ref 26.0–34.0)
MCHC: 33.8 g/dL (ref 30.0–36.0)
MCV: 84.6 fL (ref 78.0–100.0)
RDW: 16.7 % — ABNORMAL HIGH (ref 11.5–15.5)

## 2012-01-09 LAB — PROTEIN ELECTROPHORESIS, SERUM
Beta 2: 7.6 % — ABNORMAL HIGH (ref 3.2–6.5)
Gamma Globulin: 31.3 % — ABNORMAL HIGH (ref 11.1–18.8)

## 2012-01-09 LAB — CULTURE, RESPIRATORY W GRAM STAIN: Culture: NORMAL

## 2012-01-09 MED ORDER — AZITHROMYCIN 500 MG PO TABS
500.0000 mg | ORAL_TABLET | Freq: Every day | ORAL | Status: DC
  Administered 2012-01-09 – 2012-01-13 (×5): 500 mg via ORAL
  Filled 2012-01-09 (×5): qty 1

## 2012-01-09 MED ORDER — ALBUTEROL SULFATE (5 MG/ML) 0.5% IN NEBU
2.5000 mg | INHALATION_SOLUTION | RESPIRATORY_TRACT | Status: DC
  Administered 2012-01-09 – 2012-01-13 (×22): 2.5 mg via RESPIRATORY_TRACT
  Filled 2012-01-09 (×22): qty 0.5

## 2012-01-09 NOTE — Progress Notes (Signed)
Patient wore her home cpap mask with 2lpm bleed in . Tolerated well. Plugged into red outlet for safety. Removed around 0600am

## 2012-01-09 NOTE — Progress Notes (Signed)
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 Subscapular cyst, soma, loculated ascites), status post cholecystectomy, status post splenectomy, no hydronephrosis hydroureter, normal appendix small bilateral pleural effusion left base posterior atelectasis  Chest x-ray 01/07/12-diffuse prominent interstitial markings, possible superimposed pneumonia with no bowel obstruction no free air  Antibiotics:  Vancomycin 2/6 >>2/7  Ceftriaxone 2/6 >>  Azithromycin 2/6 >>   Subjective  Feels a little short of breath. Has some now increasing chest pain more so than abdominal pain. States that it is positional and worsened by movement laying down and turning in different directions.  No pain running down the arm no pain in neck or jaw.  States has been a little bit short of breath, really wants to eat.   Objective    Interim History: Nursing notes reviewed   Objective: Filed Vitals:   01/08/12 0531 01/08/12 1420 01/08/12 2300 01/09/12 0550  BP: 98/54 130/78 130/73 151/77  Pulse: 87 100 95 88  Temp: 98.4 F (36.9 C) 99.5 F (37.5 C)  97.7 F (36.5 C)  TempSrc: Axillary Oral Oral Oral  Resp: 16 20 20 22   Height:      Weight:      SpO2: 93% 92% 90% 95%    Intake/Output Summary (Last 24 hours) at 01/09/12 1045 Last data filed at 01/09/12 0546  Gross per 24 hour  Intake 2981.66 ml  Output      0 ml  Net 2981.66 ml    Exam:  General: Well but seems to have some wheezing although able to talk  very Cardiovascular: S1-S2 no murmur rub or gallop Respiratory: Wheezing throughout no tactile vocal fremitus or resonance Abdomen: Abdomen is soft, but in the epigastrium is tender to pressure no rebound no guarding lower abdominal scar noted. Skin NAD Neuro and 80  Data Reviewed: Basic Metabolic Panel:  Lab 01/09/12 8119 01/07/12 0650  NA 134* 133*  K 4.3 3.6  CL 104 99  CO2 21 22  GLUCOSE 137* 124*  BUN 21 10  CREATININE 1.37* 0.74  CALCIUM 7.9* 9.1  MG -- --  PHOS -- --   Liver Function Tests:  Lab 01/09/12 0503 01/07/12 0650  AST 43* 70*  ALT 33 45*  ALKPHOS 269* 313*  BILITOT 1.4* 0.6  PROT 7.8 9.1*  ALBUMIN 2.3* 3.2*    Lab 01/09/12 0503 01/07/12 0650  LIPASE 21 78*  AMYLASE -- --   No results found for this basename: AMMONIA:5 in the last 168 hours CBC:  Lab 01/09/12 0503 01/07/12 0650  WBC 38.2* 24.7*  NEUTROABS -- 17.0*  HGB 11.3* 12.5  HCT 33.4* 35.8*  MCV 84.6 82.1  PLT 334 199   Cardiac Enzymes:  Lab 01/07/12 0650  CKTOTAL --  CKMB --  CKMBINDEX --  TROPONINI <0.30   BNP: No components found with this basename: POCBNP:5 CBG: No results found for this basename: GLUCAP:5 in the last 168 hours  Recent Results (from the past 240 hour(s))  CULTURE, RESPIRATORY     Status: Normal   Collection Time   01/07/12 11:10 AM      Component Value Range  Status Comment   Specimen Description SPUTUM   Final    Special Requests NONE   Final    Gram Stain     Final    Value: FEW WBC PRESENT,BOTH PMN AND MONONUCLEAR     RARE SQUAMOUS EPITHELIAL CELLS PRESENT     RARE GRAM POSITIVE COCCI     IN PAIRS RARE GRAM POSITIVE RODS     RARE GRAM NEGATIVE RODS   Culture NORMAL OROPHARYNGEAL FLORA   Final    Report Status 01/09/2012 FINAL   Final      Studies: Ct Abdomen Pelvis W Contrast  01/07/2012  *RADIOLOGY REPORT*  Clinical Data: Abdominal pain, elevated LFTs, status post splenectomy and cholecystectomy  CT ABDOMEN AND PELVIS WITH CONTRAST  Technique:   Multidetector CT imaging of the abdomen and pelvis was performed following the standard protocol during bolus administration of intravenous contrast.  Contrast: OMNIPAQUE IOHEXOL 300 MG/ML IV SOLN  Comparison: 06/18/2011  Findings: Small hiatal hernia is noted.  Cardiomegaly.  There is a small left pleural effusion with left basilar posterior atelectasis or infiltrate.  Bilateral lower lobe peribronchial thickening. Small right pleural effusion.  Again noted nodular contour of the liver consistent with cirrhosis. In anterior aspect of the left hepatic lobe there is a superficial lesion measures 12.5 HU in attenuation measures 5.7 cm in length. This may represent a loculated ascites, small subcapsular cyst or residual seroma in the site of previous hematoma.  No intrahepatic biliary ductal dilatation.  No enhancing hepatic lesion is identified.  The pancreas, and adrenal glands are unremarkable.  The patient is status post laminectomy.  Sagittal images of the spine are unremarkable.  Small amount of contrast material noted in distal esophagus probable from gastroesophageal reflux disease.  There is no small bowel obstruction.  No ascites or free air.  No adenopathy.  No pericecal inflammation is noted.  Normal appendix is partially visualized axial image 53.  The uterus and adnexa are unremarkable. The urinary bladder is unremarkable.  A left inguinal lymph node measures 1.6 by 1.2 cm borderline by size criteria.  Kidneys are symmetrical in size.  Bilateral kidneys shows a lobulated contour.  No hydronephrosis or hydroureter.  Delayed renal images shows bilateral renal symmetrical excretion. Bilateral visualized proximal ureter is unremarkable.  IMPRESSION:  1.  Again noted nodular contour of the liver probable due to cirrhosis.  There is a fluid density lesion anterior aspect of the left hepatic lobe superficially.  This may represent a subcapsular cyst, residual seroma in the site of previous abdominal  hematoma or small loculated ascites.  No enhancing hepatic lesion is noted.  No intrahepatic biliary ductal dilatation.  2.  Status post cholecystectomy. 3.  Status post splenectomy. 4.  No hydronephrosis or hydroureter.  No small bowel obstruction. 5.  Normal appendix is clearly visualized. 6.  There is a small bilateral pleural effusion.  Left base posteriorly atelectasis or infiltrate.  Original Report Authenticated By: Natasha Mead, M.D.   Dg Abd Acute W/chest  01/07/2012  *RADIOLOGY REPORT*  Clinical Data: Left chest and abdomen pain, some shortness of breath  ACUTE ABDOMEN SERIES (ABDOMEN 2 VIEW & CHEST 1 VIEW)  Comparison: Chest and acute abdomen of 06/17/2011 and CT abdomen pelvis of 06/18/2011  Findings:  The lungs again are not optimally aerated.  There are prominent interstitial markings bilaterally possibly chronic in nature, but accentuated by poor inspiration.  Superimposed pneumonia or edema cannot be excluded.  No definite effusion is seen.  Heart  size is stable.  Supine and erect views of the abdomen show both large and small bowel gas to be present.  No bowel obstruction is seen.  No free air is noted.  Surgical clips are present in the right upper quadrant from prior cholecystectomy. No opaque calculi are seen.  IMPRESSION:  1.  Diffusely prominent interstitial markings.  Possibly chronic and accentuated by poor inspiration, but superimposed pneumonia or less likely edema cannot be excluded. 2.  No bowel obstruction.  No free air.  Original Report Authenticated By: Juline Patch, M.D.    Scheduled Meds:    . albuterol  2.5 mg Nebulization Q4H  . azithromycin  500 mg Oral Daily  . guaiFENesin  600 mg Oral BID  . methylPREDNISolone (SOLU-MEDROL) injection  80 mg Intravenous Q8H  . DISCONTD: amLODipine  5 mg Oral Daily  . DISCONTD: azithromycin  500 mg Intravenous Q24H  . DISCONTD: cefTRIAXone (ROCEPHIN)  IV  1 g Intravenous Q24H  . DISCONTD: vancomycin  1,000 mg Intravenous Q12H    Continuous Infusions:   . 0.9 % NaCl with KCl 20 mEq / L 100 mL/hr at 01/09/12 0546  . DISCONTD: sodium chloride 75 mL/hr at 01/08/12 1301     Assessment/Plan: 1. Pancreatitis-lipase dropped from 73-20. Will trial advance diet.  Could have also passed a common bile duct stone, as pain improved. CT abdomen unrevealing.  Continue to monitor.  Cutback IV fluids. LFTs show some improvement 2. Sarcoidosis-continue Solu-Medrol, changed nebs to albuterol every 4 hours scheduled. We'll attempt to wean oxygen in a.m. and the patient 3. Community-acquired pneumonia-antibiotics narrowed today to azithromycin from Rocephin and vancomycin. She has normal oropharyngeal flora. 4. Proteinemia/hypoalbuminemia-followup SPEP and UPEP when available-her protein levels on LFTs today are within normal range 5. ITP-sees Dr. Arbutus Ped for the same and is status post splenectomy. Platelet count 334 6. Leukocytosis-could be because of Solu-Medrol versus infection. Continue azithromycin. 7. ? Hypertension-reassess as an outpatient need for medication. Was transiently on Norvasc. 8. AK I.-possibly secondary to volume depletion. We'll repeat symmetric in a.m. 9. Depression continue citalopram 10. Obstructive sleep apnea-continue CPAP at night 11. Hyponatremia-continue IV fluids  Code Status: Full Family Communication: Patient will communicate plan for Disposition Plan: Discharge 2-3 days or as possible   Pleas Koch, MD  [facility] Pager [phone] 01/09/2012, 10:45 AM    LOS: 2 days

## 2012-01-10 LAB — COMPREHENSIVE METABOLIC PANEL
Albumin: 2.5 g/dL — ABNORMAL LOW (ref 3.5–5.2)
BUN: 30 mg/dL — ABNORMAL HIGH (ref 6–23)
Creatinine, Ser: 1.26 mg/dL — ABNORMAL HIGH (ref 0.50–1.10)
Total Bilirubin: 0.6 mg/dL (ref 0.3–1.2)
Total Protein: 8.6 g/dL — ABNORMAL HIGH (ref 6.0–8.3)

## 2012-01-10 LAB — CBC
HCT: 32.5 % — ABNORMAL LOW (ref 36.0–46.0)
MCV: 82.7 fL (ref 78.0–100.0)
Platelets: 485 10*3/uL — ABNORMAL HIGH (ref 150–400)
RBC: 3.93 MIL/uL (ref 3.87–5.11)
WBC: 40.8 10*3/uL — ABNORMAL HIGH (ref 4.0–10.5)

## 2012-01-10 MED ORDER — HYDROMORPHONE HCL PF 1 MG/ML IJ SOLN
2.0000 mg | INTRAMUSCULAR | Status: DC | PRN
  Administered 2012-01-11 – 2012-01-12 (×4): 2 mg via INTRAVENOUS
  Filled 2012-01-10 (×4): qty 2

## 2012-01-10 NOTE — Progress Notes (Signed)
Patient wore her own cpap with 2 lpm bleed in . Plugged into red outlet for safety

## 2012-01-10 NOTE — Progress Notes (Signed)
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 Subscapular cyst, soma, loculated ascites), status post cholecystectomy, status post splenectomy, no hydronephrosis hydroureter, normal appendix small bilateral pleural effusion left base posterior atelectasis  Chest x-ray 01/07/12-diffuse prominent interstitial markings, possible superimposed pneumonia with no bowel obstruction no free air  Antibiotics:  Vancomycin 2/6 >>2/7  Ceftriaxone 2/6 >> 2/7  Azithromycin 2/6 >> changed to oral azithromycin 2/7 to complete 5 day course   Subjective  Better. Abdominal pain seems to subsided. States has been a little bit short of breath.  Tolerating by mouth fairly but does get some abdominal discomfort/chest discomfort if she eats. No fever no chills no nausea.  Ambulating little   Objective    Interim History: Nursing notes reviewed   Objective: Filed Vitals:   01/10/12 0500 01/10/12 0839 01/10/12 0840 01/10/12 1124  BP: 116/46     Pulse: 81 90    Temp: 97.4 F (36.3 C)     TempSrc: Oral     Resp: 22 22  19   Height:      Weight:      SpO2: 91% 90% 91% 92%    Intake/Output Summary (Last 24 hours) at 01/10/12 1456 Last data filed at 01/10/12 0600  Gross per 24 hour  Intake 2014.17 ml  Output      0 ml  Net 2014.17 ml    Exam:  General: Well , not short of breath as yesterday Cardiovascular: S1-S2 no murmur rub or gallop Respiratory: Wheezing decreased throughout no tactile vocal fremitus  or resonance Abdomen: Abdomen is soft, but in the epigastrium mild early tender to pressure no rebound no guarding lower abdominal scar noted. Skin NAD Neuro intact  Data Reviewed: Basic Metabolic Panel:  Lab 01/10/12 8119 01/09/12 0503 01/07/12 0650  NA 139 134* 133*  K 4.3 4.3 --  CL 107 104 99  CO2 20 21 22   GLUCOSE 147* 137* 124*  BUN 30* 21 10  CREATININE 1.26* 1.37* 0.74  CALCIUM 8.6 7.9* 9.1  MG -- -- --  PHOS -- -- --   Liver Function Tests:  Lab 01/10/12 0542 01/09/12 0503 01/07/12 0650  AST 39* 43* 70*  ALT 35 33 45*  ALKPHOS 273* 269* 313*  BILITOT 0.6 1.4* 0.6  PROT 8.6* 7.8 9.1*  ALBUMIN 2.5* 2.3* 3.2*    Lab 01/10/12 0542 01/09/12 0503 01/07/12 0650  LIPASE 38 21 78*  AMYLASE -- -- --   No results found for this basename: AMMONIA:5 in the last 168 hours CBC:  Lab 01/10/12 0542 01/09/12 0503 01/07/12 0650  WBC 40.8* 38.2* 24.7*  NEUTROABS -- -- 17.0*  HGB 11.2* 11.3* 12.5  HCT 32.5* 33.4* 35.8*  MCV 82.7 84.6 82.1  PLT 485* 334 199   Cardiac Enzymes:  Lab 01/07/12 0650  CKTOTAL --  CKMB --  CKMBINDEX --  TROPONINI <0.30   BNP: No components found with this basename: POCBNP:5 CBG: No results found for this basename: GLUCAP:5 in the last 168 hours  Recent Results (from the past 240 hour(s))  CULTURE, RESPIRATORY     Status: Normal   Collection Time  01/07/12 11:10 AM      Component Value Range Status Comment   Specimen Description SPUTUM   Final    Special Requests NONE   Final    Gram Stain     Final    Value: FEW WBC PRESENT,BOTH PMN AND MONONUCLEAR     RARE SQUAMOUS EPITHELIAL CELLS PRESENT     RARE GRAM POSITIVE COCCI     IN PAIRS RARE GRAM POSITIVE RODS     RARE GRAM NEGATIVE RODS   Culture NORMAL OROPHARYNGEAL FLORA   Final    Report Status 01/09/2012 FINAL   Final      Studies: Ct Abdomen Pelvis W Contrast  01/07/2012  *RADIOLOGY REPORT*  Clinical Data: Abdominal pain, elevated LFTs, status post splenectomy and  cholecystectomy  CT ABDOMEN AND PELVIS WITH CONTRAST  Technique:  Multidetector CT imaging of the abdomen and pelvis was performed following the standard protocol during bolus administration of intravenous contrast.  Contrast: OMNIPAQUE IOHEXOL 300 MG/ML IV SOLN  Comparison: 06/18/2011  Findings: Small hiatal hernia is noted.  Cardiomegaly.  There is a small left pleural effusion with left basilar posterior atelectasis or infiltrate.  Bilateral lower lobe peribronchial thickening. Small right pleural effusion.  Again noted nodular contour of the liver consistent with cirrhosis. In anterior aspect of the left hepatic lobe there is a superficial lesion measures 12.5 HU in attenuation measures 5.7 cm in length. This may represent a loculated ascites, small subcapsular cyst or residual seroma in the site of previous hematoma.  No intrahepatic biliary ductal dilatation.  No enhancing hepatic lesion is identified.  The pancreas, and adrenal glands are unremarkable.  The patient is status post laminectomy.  Sagittal images of the spine are unremarkable.  Small amount of contrast material noted in distal esophagus probable from gastroesophageal reflux disease.  There is no small bowel obstruction.  No ascites or free air.  No adenopathy.  No pericecal inflammation is noted.  Normal appendix is partially visualized axial image 53.  The uterus and adnexa are unremarkable. The urinary bladder is unremarkable.  A left inguinal lymph node measures 1.6 by 1.2 cm borderline by size criteria.  Kidneys are symmetrical in size.  Bilateral kidneys shows a lobulated contour.  No hydronephrosis or hydroureter.  Delayed renal images shows bilateral renal symmetrical excretion. Bilateral visualized proximal ureter is unremarkable.  IMPRESSION:  1.  Again noted nodular contour of the liver probable due to cirrhosis.  There is a fluid density lesion anterior aspect of the left hepatic lobe superficially.  This may represent a  subcapsular cyst, residual seroma in the site of previous abdominal hematoma or small loculated ascites.  No enhancing hepatic lesion is noted.  No intrahepatic biliary ductal dilatation.  2.  Status post cholecystectomy. 3.  Status post splenectomy. 4.  No hydronephrosis or hydroureter.  No small bowel obstruction. 5.  Normal appendix is clearly visualized. 6.  There is a small bilateral pleural effusion.  Left base posteriorly atelectasis or infiltrate.  Original Report Authenticated By: Natasha Mead, M.D.   Dg Abd Acute W/chest  01/07/2012  *RADIOLOGY REPORT*  Clinical Data: Left chest and abdomen pain, some shortness of breath  ACUTE ABDOMEN SERIES (ABDOMEN 2 VIEW & CHEST 1 VIEW)  Comparison: Chest and acute abdomen of 06/17/2011 and CT abdomen pelvis of 06/18/2011  Findings:  The lungs again are not optimally aerated.  There are prominent interstitial markings bilaterally possibly chronic in nature, but accentuated by poor inspiration.  Superimposed pneumonia or edema  cannot be excluded.  No definite effusion is seen.  Heart size is stable.  Supine and erect views of the abdomen show both large and small bowel gas to be present.  No bowel obstruction is seen.  No free air is noted.  Surgical clips are present in the right upper quadrant from prior cholecystectomy. No opaque calculi are seen.  IMPRESSION:  1.  Diffusely prominent interstitial markings.  Possibly chronic and accentuated by poor inspiration, but superimposed pneumonia or less likely edema cannot be excluded. 2.  No bowel obstruction.  No free air.  Original Report Authenticated By: Juline Patch, M.D.    Scheduled Meds:    . albuterol  2.5 mg Nebulization Q4H  . azithromycin  500 mg Oral Daily  . guaiFENesin  600 mg Oral BID  . methylPREDNISolone (SOLU-MEDROL) injection  80 mg Intravenous Q8H   Continuous Infusions:    . 0.9 % NaCl with KCl 20 mEq / L 50 mL/hr at 01/09/12 1437     Assessment/Plan: 1. Pancreatitis-lipase dropped  from 73-20. Will trial advance diet.  Could have also passed a common bile duct stone, as pain improved. CT abdomen unrevealing.  Continue to monitor.  Discontinue IV fluids. LFTs stable. Change to Dilaudid every 3 hours. Will need followup labs ([facility]) in 2-3 weeks. 2. Sarcoidosis-continue Solu-Medrol 80 mg q. 8-consider changing to by mouth tomorrow, changed nebs to albuterol every 4 hours scheduled. We'll attempt to wean oxygen.  Patient to ambulate-she has used oxygen before when she had pneumonia. discharge home with oxygen if she desaturates. 3. Community-acquired pneumonia-antibiotics narrowed today to azithromycin from Rocephin and vancomycin. She has normal oropharyngeal flora.  No fever white count likely elevated because of splenectomy 4. Proteinemia/hypoalbuminemia-followup SPEP and UPEP when available-her protein levels on LFTs today are within normal range-protein electrophoresis = nonspecific diffuse polyclonal increase in gamma globulin. This was a one-time result when her LFTs were deranged and would consider repetition as an outpatient of CMEt with PCP 5. ITP-sees Dr. Arbutus Ped for the same and is status post splenectomy. Platelet count 334 6. Leukocytosis-could be because of Solu-Medrol versus infection, likely secondary to splenectomy. Continue azithromycin. 7. ? Hypertension-reassess as an outpatient need for medication. Was transiently on Norvasc. 8. AK I.-possibly secondary to volume depletion. Baseline CKD stage I.  Discontinue IV fluid 9. Depression continue citalopram 10. Obstructive sleep apnea-continue CPAP at night 11. Hyponatremia-replaced. 12. Impaired glucose tolerance-blood sugars moderately elevated. Would re-assess as an outpatient   Code Status: Full Family Communication: Patient will communicate plan for Disposition Plan: Discharge 2-3 days or as possible   Pleas Koch, MD  [facility] Pager [phone] 01/10/2012, 2:56 PM    LOS: 3 days

## 2012-01-11 ENCOUNTER — Inpatient Hospital Stay (HOSPITAL_COMMUNITY): Payer: Medicare Other

## 2012-01-11 MED ORDER — PREDNISONE 20 MG PO TABS: 40.0000 mg | ORAL_TABLET | Freq: Every day | ORAL | Status: DC

## 2012-01-11 MED ORDER — METHYLPREDNISOLONE 4 MG PO TABS
8.0000 mg | ORAL_TABLET | Freq: Every day | ORAL | Status: DC
  Administered 2012-01-12 – 2012-01-13 (×2): 8 mg via ORAL
  Filled 2012-01-11 (×2): qty 2

## 2012-01-11 MED ORDER — IOHEXOL 300 MG/ML  SOLN
100.0000 mL | Freq: Once | INTRAMUSCULAR | Status: AC | PRN
  Administered 2012-01-11: 100 mL via INTRAVENOUS

## 2012-01-11 NOTE — Progress Notes (Signed)
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 Subscapular cyst, soma, loculated ascites), status post cholecystectomy, status post splenectomy, no hydronephrosis hydroureter, normal appendix small bilateral pleural effusion left base posterior atelectasis  Chest x-ray 01/07/12-diffuse prominent interstitial markings, possible superimposed pneumonia with no bowel obstruction no free air  Antibiotics:  Vancomycin 2/6 >>2/7  Ceftriaxone 2/6 >> 2/7  Azithromycin 2/6 >> changed to oral azithromycin 2/7 to complete 5 day course   Subjective  Better. Abdominal pain seems to subsided.   Chest pain and SOB last night.   Objective    Interim History: Nursing notes reviewed   Objective: Filed Vitals:   01/10/12 2200 01/11/12 0046 01/11/12 0408 01/11/12 0600  BP: 135/71   129/76  Pulse: 89   70  Temp: 97.7 F (36.5 C)   98 F (36.7 C)  TempSrc: Oral   Oral  Resp: 20   18  Height:      Weight:      SpO2: 92% 97% 96% 95%   No intake or output data in the 24 hours ending 01/11/12 8119  Exam:  General: patient appears her stated age Cardiovascular: S1-S2 no murmur rub or gallop Respiratory: no wheezing. Abdomen: Abdomen is soft, but in the epigastrium mild early tender to pressure no rebound no guarding lower abdominal scar noted. Ext:   Trace edema Neuro intact  Data Reviewed: Basic Metabolic Panel:  Lab 01/10/12 1478 01/09/12 0503 01/07/12 0650  NA 139 134* 133*   K 4.3 4.3 --  CL 107 104 99  CO2 20 21 22   GLUCOSE 147* 137* 124*  BUN 30* 21 10  CREATININE 1.26* 1.37* 0.74  CALCIUM 8.6 7.9* 9.1  MG -- -- --  PHOS -- -- --   Liver Function Tests:  Lab 01/10/12 0542 01/09/12 0503 01/07/12 0650  AST 39* 43* 70*  ALT 35 33 45*  ALKPHOS 273* 269* 313*  BILITOT 0.6 1.4* 0.6  PROT 8.6* 7.8 9.1*  ALBUMIN 2.5* 2.3* 3.2*    Lab 01/10/12 0542 01/09/12 0503 01/07/12 0650  LIPASE 38 21 78*  AMYLASE -- -- --   No results found for this basename: AMMONIA:5 in the last 168 hours CBC:  Lab 01/10/12 0542 01/09/12 0503 01/07/12 0650  WBC 40.8* 38.2* 24.7*  NEUTROABS -- -- 17.0*  HGB 11.2* 11.3* 12.5  HCT 32.5* 33.4* 35.8*  MCV 82.7 84.6 82.1  PLT 485* 334 199   Cardiac Enzymes:  Lab 01/07/12 0650  CKTOTAL --  CKMB --  CKMBINDEX --  TROPONINI <0.30   BNP: No components found with this basename: POCBNP:5 CBG: No results found for this basename: GLUCAP:5 in the last 168 hours  Recent Results (from the past 240 hour(s))  CULTURE, RESPIRATORY     Status: Normal   Collection Time   01/07/12 11:10 AM      Component Value Range Status Comment   Specimen Description SPUTUM   Final    Special Requests NONE   Final    Gram Stain     Final    Value: FEW WBC PRESENT,BOTH PMN AND  MONONUCLEAR     RARE SQUAMOUS EPITHELIAL CELLS PRESENT     RARE GRAM POSITIVE COCCI     IN PAIRS RARE GRAM POSITIVE RODS     RARE GRAM NEGATIVE RODS   Culture NORMAL OROPHARYNGEAL FLORA   Final    Report Status 01/09/2012 FINAL   Final      Studies: Ct Abdomen Pelvis W Contrast  01/07/2012  *RADIOLOGY REPORT*  Clinical Data: Abdominal pain, elevated LFTs, status post splenectomy and cholecystectomy  CT ABDOMEN AND PELVIS WITH CONTRAST  Technique:  Multidetector CT imaging of the abdomen and pelvis was performed following the standard protocol during bolus administration of intravenous contrast.  Contrast: OMNIPAQUE IOHEXOL 300 MG/ML IV SOLN  Comparison:  06/18/2011  Findings: Small hiatal hernia is noted.  Cardiomegaly.  There is a small left pleural effusion with left basilar posterior atelectasis or infiltrate.  Bilateral lower lobe peribronchial thickening. Small right pleural effusion.  Again noted nodular contour of the liver consistent with cirrhosis. In anterior aspect of the left hepatic lobe there is a superficial lesion measures 12.5 HU in attenuation measures 5.7 cm in length. This may represent a loculated ascites, small subcapsular cyst or residual seroma in the site of previous hematoma.  No intrahepatic biliary ductal dilatation.  No enhancing hepatic lesion is identified.  The pancreas, and adrenal glands are unremarkable.  The patient is status post laminectomy.  Sagittal images of the spine are unremarkable.  Small amount of contrast material noted in distal esophagus probable from gastroesophageal reflux disease.  There is no small bowel obstruction.  No ascites or free air.  No adenopathy.  No pericecal inflammation is noted.  Normal appendix is partially visualized axial image 53.  The uterus and adnexa are unremarkable. The urinary bladder is unremarkable.  A left inguinal lymph node measures 1.6 by 1.2 cm borderline by size criteria.  Kidneys are symmetrical in size.  Bilateral kidneys shows a lobulated contour.  No hydronephrosis or hydroureter.  Delayed renal images shows bilateral renal symmetrical excretion. Bilateral visualized proximal ureter is unremarkable.  IMPRESSION:  1.  Again noted nodular contour of the liver probable due to cirrhosis.  There is a fluid density lesion anterior aspect of the left hepatic lobe superficially.  This may represent a subcapsular cyst, residual seroma in the site of previous abdominal hematoma or small loculated ascites.  No enhancing hepatic lesion is noted.  No intrahepatic biliary ductal dilatation.  2.  Status post cholecystectomy. 3.  Status post splenectomy. 4.  No hydronephrosis or hydroureter.   No small bowel obstruction. 5.  Normal appendix is clearly visualized. 6.  There is a small bilateral pleural effusion.  Left base posteriorly atelectasis or infiltrate.  Original Report Authenticated By: Natasha Mead, M.D.   Dg Abd Acute W/chest  01/07/2012  *RADIOLOGY REPORT*  Clinical Data: Left chest and abdomen pain, some shortness of breath  ACUTE ABDOMEN SERIES (ABDOMEN 2 VIEW & CHEST 1 VIEW)  Comparison: Chest and acute abdomen of 06/17/2011 and CT abdomen pelvis of 06/18/2011  Findings:  The lungs again are not optimally aerated.  There are prominent interstitial markings bilaterally possibly chronic in nature, but accentuated by poor inspiration.  Superimposed pneumonia or edema cannot be excluded.  No definite effusion is seen.  Heart size is stable.  Supine and erect views of the abdomen show both large and small bowel gas to be present.  No bowel obstruction is seen.  No free air is noted.  Surgical clips are present  in the right upper quadrant from prior cholecystectomy. No opaque calculi are seen.  IMPRESSION:  1.  Diffusely prominent interstitial markings.  Possibly chronic and accentuated by poor inspiration, but superimposed pneumonia or less likely edema cannot be excluded. 2.  No bowel obstruction.  No free air.  Original Report Authenticated By: Juline Patch, M.D.    Scheduled Meds:    . albuterol  2.5 mg Nebulization Q4H  . azithromycin  500 mg Oral Daily  . guaiFENesin  600 mg Oral BID  . predniSONE  40 mg Oral Q breakfast  . DISCONTD: methylPREDNISolone (SOLU-MEDROL) injection  80 mg Intravenous Q8H   Continuous Infusions:    . DISCONTD: 0.9 % NaCl with KCl 20 mEq / L 50 mL/hr at 01/09/12 1437     Assessment/Plan: 1. Pancreatitis-lipase  CT abdomen unrevealing.  Continue to monitor.  Discontinue IV fluids. LFTs stable. Change to Dilaudid every 3 hours. Will need followup labs ([facility]) in 2-3 weeks. 2. Sarcoidosis-Changed to PO steroid  changed nebs to albuterol every 4  hours scheduled. We'll attempt to wean oxygen.  Patient to ambulate-she has used oxygen before when she had pneumonia. discharge home with oxygen if she desaturates. 3. Community-acquired pneumonia-antibiotics narrowed today to azithromycin from Rocephin and vancomycin. She has normal oropharyngeal flora.  No fever white count likely elevated because of splenectomy 4. Proteinemia/hypoalbuminemia-followup SPEP and UPEP when available-her protein levels on LFTs today are within normal range-protein electrophoresis = nonspecific diffuse polyclonal increase in gamma globulin. This was a one-time result when her LFTs were deranged and would consider repetition as an outpatient of CMEt with PCP 5. ITP-sees Dr. Arbutus Ped for the same and is status post splenectomy. Platelet count 334 6. Leukocytosis-could be because of Solu-Medrol versus infection, likely secondary to splenectomy. Continue azithromycin. 7. ? Hypertension-reassess as an outpatient need for medication. Was transiently on Norvasc. 8. AK I.-possibly secondary to volume depletion. Baseline CKD stage I.  Discontinue IV fluid 9. Depression continue citalopram 10. Obstructive sleep apnea-continue CPAP at night 11. Hyponatremia-replaced. 12. Impaired glucose tolerance-blood sugars moderately elevated. Would re-assess as an outpatient  13. CP/SOB:  Will get CT of the chest to r/o PE. Code Status: Full Family Communication: Patient will communicate plan for Disposition Plan: Discharge 2-3 days or as possible   Molli Posey, MD  [facility] Pager [phone] 01/11/2012, 9:22 AM    LOS: 4 days

## 2012-01-12 LAB — BASIC METABOLIC PANEL
BUN: 28 mg/dL — ABNORMAL HIGH (ref 6–23)
Chloride: 105 mEq/L (ref 96–112)
GFR calc Af Amer: 57 mL/min — ABNORMAL LOW (ref >90)
Glucose, Bld: 91 mg/dL (ref 70–99)
Potassium: 3.8 mEq/L (ref 3.5–5.1)

## 2012-01-12 LAB — CBC
HCT: 34.3 % — ABNORMAL LOW (ref 36.0–46.0)
Hemoglobin: 11.4 g/dL — ABNORMAL LOW (ref 12.0–15.0)
RDW: 17.2 % — ABNORMAL HIGH (ref 11.5–15.5)
WBC: 20.1 10*3/uL — ABNORMAL HIGH (ref 4.0–10.5)

## 2012-01-12 NOTE — Progress Notes (Signed)
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 Subscapular cyst, soma, loculated ascites), status post cholecystectomy, status post splenectomy, no hydronephrosis hydroureter, normal appendix small bilateral pleural effusion left base posterior atelectasis  Chest x-ray 01/07/12-diffuse prominent interstitial markings, possible superimposed pneumonia with no bowel obstruction no free air  Antibiotics:  Vancomycin 2/6 >>2/7  Ceftriaxone 2/6 >> 2/7  Azithromycin 2/6 >> changed to oral azithromycin 2/7 to complete 5 day course   Subjective  Better. Abdominal pain seems to subsided.   Chest pain and SOB improved   Objective    Interim History: Nursing notes reviewed   Objective: Filed Vitals:   01/12/12 0413 01/12/12 0600 01/12/12 1145 01/12/12 1425  BP:  154/91  167/90  Pulse:  72  89  Temp:  97.8 F (36.6 C)  98.4 F (36.9 C)  TempSrc:  Oral  Oral  Resp:  20  20  Height:      Weight:      SpO2: 96% 94% 91% 94%   No intake or output data in the 24 hours ending 01/12/12 2044  Exam:  General: patient appears her stated age Cardiovascular: S1-S2 no murmur rub or gallop Respiratory: no wheezing. Abdomen: Abdomen is soft, but in the epigastrium mild early tender to pressure no rebound no guarding lower abdominal scar noted. Ext:   Trace edema Neuro intact  Data Reviewed: Basic Metabolic Panel:  Lab 01/12/12 8119 01/10/12 0542 01/09/12 0503 01/07/12 0650  NA  137 139 134* 133*  K 3.8 4.3 -- --  CL 105 107 104 99  CO2 24 20 21 22   GLUCOSE 91 147* 137* 124*  BUN 28* 30* 21 10  CREATININE 1.27* 1.26* 1.37* 0.74  CALCIUM 8.7 8.6 7.9* 9.1  MG -- -- -- --  PHOS -- -- -- --   Liver Function Tests:  Lab 01/10/12 0542 01/09/12 0503 01/07/12 0650  AST 39* 43* 70*  ALT 35 33 45*  ALKPHOS 273* 269* 313*  BILITOT 0.6 1.4* 0.6  PROT 8.6* 7.8 9.1*  ALBUMIN 2.5* 2.3* 3.2*    Lab 01/10/12 0542 01/09/12 0503 01/07/12 0650  LIPASE 38 21 78*  AMYLASE -- -- --   No results found for this basename: AMMONIA:5 in the last 168 hours CBC:  Lab 01/12/12 0520 01/10/12 0542 01/09/12 0503 01/07/12 0650  WBC 20.1* 40.8* 38.2* 24.7*  NEUTROABS -- -- -- 17.0*  HGB 11.4* 11.2* 11.3* 12.5  HCT 34.3* 32.5* 33.4* 35.8*  MCV 83.7 82.7 84.6 82.1  PLT 569* 485* 334 199   Cardiac Enzymes:  Lab 01/07/12 0650  CKTOTAL --  CKMB --  CKMBINDEX --  TROPONINI <0.30   BNP: No components found with this basename: POCBNP:5 CBG: No results found for this basename: GLUCAP:5 in the last 168 hours  Recent Results (from the past 240 hour(s))  CULTURE, RESPIRATORY     Status: Normal   Collection Time   01/07/12 11:10 AM      Component Value Range Status Comment   Specimen Description SPUTUM   Final    Special Requests NONE   Final  Gram Stain     Final    Value: FEW WBC PRESENT,BOTH PMN AND MONONUCLEAR     RARE SQUAMOUS EPITHELIAL CELLS PRESENT     RARE GRAM POSITIVE COCCI     IN PAIRS RARE GRAM POSITIVE RODS     RARE GRAM NEGATIVE RODS   Culture NORMAL OROPHARYNGEAL FLORA   Final    Report Status 01/09/2012 FINAL   Final      Studies: Ct Abdomen Pelvis W Contrast  01/07/2012  *RADIOLOGY REPORT*  Clinical Data: Abdominal pain, elevated LFTs, status post splenectomy and cholecystectomy  CT ABDOMEN AND PELVIS WITH CONTRAST  Technique:  Multidetector CT imaging of the abdomen and pelvis was performed following the standard protocol during bolus administration of  intravenous contrast.  Contrast: OMNIPAQUE IOHEXOL 300 MG/ML IV SOLN  Comparison: 06/18/2011  Findings: Small hiatal hernia is noted.  Cardiomegaly.  There is a small left pleural effusion with left basilar posterior atelectasis or infiltrate.  Bilateral lower lobe peribronchial thickening. Small right pleural effusion.  Again noted nodular contour of the liver consistent with cirrhosis. In anterior aspect of the left hepatic lobe there is a superficial lesion measures 12.5 HU in attenuation measures 5.7 cm in length. This may represent a loculated ascites, small subcapsular cyst or residual seroma in the site of previous hematoma.  No intrahepatic biliary ductal dilatation.  No enhancing hepatic lesion is identified.  The pancreas, and adrenal glands are unremarkable.  The patient is status post laminectomy.  Sagittal images of the spine are unremarkable.  Small amount of contrast material noted in distal esophagus probable from gastroesophageal reflux disease.  There is no small bowel obstruction.  No ascites or free air.  No adenopathy.  No pericecal inflammation is noted.  Normal appendix is partially visualized axial image 53.  The uterus and adnexa are unremarkable. The urinary bladder is unremarkable.  A left inguinal lymph node measures 1.6 by 1.2 cm borderline by size criteria.  Kidneys are symmetrical in size.  Bilateral kidneys shows a lobulated contour.  No hydronephrosis or hydroureter.  Delayed renal images shows bilateral renal symmetrical excretion. Bilateral visualized proximal ureter is unremarkable.  IMPRESSION:  1.  Again noted nodular contour of the liver probable due to cirrhosis.  There is a fluid density lesion anterior aspect of the left hepatic lobe superficially.  This may represent a subcapsular cyst, residual seroma in the site of previous abdominal hematoma or small loculated ascites.  No enhancing hepatic lesion is noted.  No intrahepatic biliary ductal dilatation.  2.  Status  post cholecystectomy. 3.  Status post splenectomy. 4.  No hydronephrosis or hydroureter.  No small bowel obstruction. 5.  Normal appendix is clearly visualized. 6.  There is a small bilateral pleural effusion.  Left base posteriorly atelectasis or infiltrate.  Original Report Authenticated By: Natasha Mead, M.D.   Dg Abd Acute W/chest  01/07/2012  *RADIOLOGY REPORT*  Clinical Data: Left chest and abdomen pain, some shortness of breath  ACUTE ABDOMEN SERIES (ABDOMEN 2 VIEW & CHEST 1 VIEW)  Comparison: Chest and acute abdomen of 06/17/2011 and CT abdomen pelvis of 06/18/2011  Findings:  The lungs again are not optimally aerated.  There are prominent interstitial markings bilaterally possibly chronic in nature, but accentuated by poor inspiration.  Superimposed pneumonia or edema cannot be excluded.  No definite effusion is seen.  Heart size is stable.  Supine and erect views of the abdomen show both large and small bowel gas to be present.  No bowel obstruction is seen.  No free air is noted.  Surgical clips are present in the right upper quadrant from prior cholecystectomy. No opaque calculi are seen.  IMPRESSION:  1.  Diffusely prominent interstitial markings.  Possibly chronic and accentuated by poor inspiration, but superimposed pneumonia or less likely edema cannot be excluded. 2.  No bowel obstruction.  No free air.  Original Report Authenticated By: Juline Patch, M.D.    Scheduled Meds:    . albuterol  2.5 mg Nebulization Q4H  . azithromycin  500 mg Oral Daily  . guaiFENesin  600 mg Oral BID  . methylPREDNISolone  8 mg Oral Q breakfast   Continuous Infusions:     Assessment/Plan: 1. Pancreatitis-lipase  CT abdomen unrevealing.  Continue to monitor.  Discontinue IV fluids. LFTs stable. Change to Dilaudid every 3 hours. Will need followup labs ([facility]) in 2-3 weeks. 2. Sarcoidosis-Changed to PO steroid  changed nebs to albuterol every 4 hours scheduled. We'll attempt to wean oxygen.  Patient to  ambulate-she has used oxygen before when she had pneumonia. discharge home with oxygen if she desaturates. 3. Community-acquired pneumonia-On azithromycin. She has normal oropharyngeal flora.  No fever white count likely elevated because of splenectomy 4. Proteinemia/hypoalbuminemia-followup SPEP and UPEP when available-her protein levels on LFTs today are within normal range-protein electrophoresis = nonspecific diffuse polyclonal increase in gamma globulin. This was a one-time result when her LFTs were deranged and would consider repetition as an outpatient of CMEt with PCP 5. ITP-sees Dr. Arbutus Ped for the same and is status post splenectomy. Platelet count stable 6. Leukocytosis-could be because of steroid  versus infection, likely secondary to splenectomy. Continue azithromycin. 7. ? Hypertension-reassess as an outpatient need for medication. Was transiently on Norvasc. Resume Norvasc 8. AK I.-possibly secondary to volume depletion. Baseline CKD stage I.   9. Depression continue citalopram 10. Obstructive sleep apnea-continue CPAP at night 11. Hyponatremia-replaced. 12. Impaired glucose tolerance-blood sugars moderately elevated. Would re-assess as an outpatient  13. CP/SOB:  CT of the chest negative for PE but does have some pleural effusion. . Code Status: Full Family Communication: Patient will communicate plan . Disposition Plan: Discharge 1-2days or as possible   Molli Posey, MD  [facility] Pager [phone] 01/12/2012, 8:44 PM    LOS: 5 days

## 2012-01-13 DIAGNOSIS — J90 Pleural effusion, not elsewhere classified: Secondary | ICD-10-CM | POA: Diagnosis present

## 2012-01-13 MED ORDER — METHYLPREDNISOLONE 4 MG PO KIT: PACK | ORAL | Status: AC

## 2012-01-13 MED ORDER — AZITHROMYCIN 250 MG PO TABS: ORAL_TABLET | ORAL | Status: AC

## 2012-01-13 NOTE — Discharge Summary (Signed)
DISCHARGE SUMMARY  [name]  MR#: [id number]  DOB:[birth year]  Date of Admission: 01/07/2012 Date of Discharge: 01/13/2012  Attending Physician:Emmah Bratcher JARRETT  Patient's [name], ERIN, PA, PA  Consults:  none  Discharge Diagnoses: .Pancreatitis .Elevated LFTs .Cough .Hyperproteinemia .Hypoalbuminemia .Leukocytosis .SARCOIDOSIS (ANY SITE) .Hypertension .Depression .Hyponatremia .Community acquired pneumonia .Bronchospasm .OSA on CPAP .Pleural effusion    Medication List  As of 01/13/2012  2:21 PM   TAKE these medications         albuterol 0.63 MG/3ML nebulizer solution   Commonly known as: ACCUNEB   Take 1 ampule by nebulization every 6 (six) hours as needed.      azithromycin 250 MG tablet   Commonly known as: ZITHROMAX   Take one daily      citalopram 20 MG tablet   Commonly known as: CELEXA   Take 20 mg by mouth daily.      methylPREDNISolone 4 MG tablet   Commonly known as: MEDROL DOSEPAK   follow package directions             Hospital Course: 1. Pancreatitis-patient had a CT of the abdomen that was unrevealing.  LFTs stable. Review prior CT scan that showed a cirrhosis of the liver. She's also had biopsy in the past for her liver to workup her transaminitis and liver pathology.  2. Sarcoidosis-Patient has a history of sarcoidosis. No issues while she was inpatient. She was on steroids. She should followup outpatient for further management of this disease. 3. Communityacquired pneumonia-patient was treated with he azithromycin. Sputum cultures showed normal oropharyngeal flora. No fever were noted while she was inpatient ; white count likely elevated chronically because of splenectomy and steroids  4. Proteinemia/hypoalbuminemia-SPEP/UPEP showed nonspecific diffuse polyclonal increase in gamma globulin. This was a one-time result when her LFTs were deranged.  I do not think further workup is needed. 5.  Leukocytosis-could be because of steroid  versus infection, likely secondary to splenectomy. ? Hypertension-reassess as an outpatient need for medication. Was transiently on Norvasc. Resume Norvasc 6. AK I.-possibly secondary to volume depletion. Baseline creatinine seems to be normal at 0.74. At the time of discharge creatinine 1.27. Patient can follow up outpatient for repeat BMP 7. . Depression she was continued on citalopram 8. Obstructive sleep apnea-she used her CPAP at nights,  no issues noted while she was inpatient 9. Hyponatremia-was probably secondary to dehydration. Sodium normalized to 137 prior to discharge. 10. Impaired glucose tolerance-blood sugars moderately elevated this was most likely secondary to the steroids. At the time of discharge blood sugar was 91. 13. CP/SOB: CT of the chest negative for PE but does have some pleural effusion review prior x-rays CT of the abdomen done back in July of last year. At that time patient also had moderate pleural effusion which is stable. Will do no further workup inpatient patient is asymptomatic. She could follow up with PCP.    Day of Discharge BP 139/95  Pulse 89  Temp(Src) 98.1 F (36.7 C) (Oral)  Resp 18  Ht 5\' 5"  (1.651 m)  Wt 120.203 kg (265 lb)  BMI 44.10 kg/m2  SpO2 96%  LMP 12/02/2011  Physical Exam: General: patient appears her stated age  Cardiovascular: S1-S2 no murmur rub or gallop  Respiratory: no wheezing.  Abdomen: Abdomen is soft, but in the epigastrium mild early tender to pressure no rebound no guarding lower abdominal scar noted.  Ext: Trace edema  Neuro intact  Disposition home  Follow-up Appts: Discharge Orders    Future  Orders Please Complete By Expires   Increase activity slowly         Follow-up Information    Follow up with GRAY, ERIN, PA in 1 week.         Tests Needing Follow-up: CBC  Time spent in discharge (includes decision making & examination of pt): 45 minutes  Signed: Valita Righter JARRETT 01/13/2012, 2:21 PM

## 2012-01-13 NOTE — Progress Notes (Signed)
Patient using her own cpap machine with 2lpm O2 bleed in, VS stable.

## 2012-04-20 ENCOUNTER — Emergency Department (HOSPITAL_COMMUNITY)
Admission: EM | Admit: 2012-04-20 | Discharge: 2012-04-20 | Disposition: A | Discharge status: Home / Self Care | Payer: Medicare Other | Attending: Emergency Medicine | Admitting: Emergency Medicine

## 2012-04-20 ENCOUNTER — Encounter (HOSPITAL_COMMUNITY): Payer: Self-pay | Admitting: Emergency Medicine

## 2012-04-20 DIAGNOSIS — I10 Essential (primary) hypertension: Secondary | ICD-10-CM | POA: Insufficient documentation

## 2012-04-20 DIAGNOSIS — Z79899 Other long term (current) drug therapy: Secondary | ICD-10-CM | POA: Insufficient documentation

## 2012-04-20 DIAGNOSIS — F329 Major depressive disorder, single episode, unspecified: Secondary | ICD-10-CM | POA: Insufficient documentation

## 2012-04-20 DIAGNOSIS — M79609 Pain in unspecified limb: Secondary | ICD-10-CM | POA: Insufficient documentation

## 2012-04-20 DIAGNOSIS — M62838 Other muscle spasm: Secondary | ICD-10-CM | POA: Insufficient documentation

## 2012-04-20 DIAGNOSIS — D869 Sarcoidosis, unspecified: Secondary | ICD-10-CM | POA: Insufficient documentation

## 2012-04-20 DIAGNOSIS — F3289 Other specified depressive episodes: Secondary | ICD-10-CM | POA: Insufficient documentation

## 2012-04-20 DIAGNOSIS — J45909 Unspecified asthma, uncomplicated: Secondary | ICD-10-CM | POA: Insufficient documentation

## 2012-04-20 DIAGNOSIS — E876 Hypokalemia: Secondary | ICD-10-CM | POA: Insufficient documentation

## 2012-04-20 LAB — CBC
HCT: 37.7 % (ref 36.0–46.0)
HCT: 36.1 % (ref 36.0–46.0)
Hemoglobin: 12.7 g/dL (ref 12.0–15.0)
Hemoglobin: 12.2 g/dL (ref 12.0–15.0)
MCHC: 33.7 g/dL (ref 30.0–36.0)
MCHC: 33.8 g/dL (ref 30.0–36.0)
MCV: 86.8 fL (ref 78.0–100.0)
RBC: 4.25 MIL/uL (ref 3.87–5.11)
RDW: 15.1 % (ref 11.5–15.5)

## 2012-04-20 LAB — BASIC METABOLIC PANEL
BUN: 19 mg/dL (ref 6–23)
Chloride: 98 mEq/L (ref 96–112)
Creatinine, Ser: 0.94 mg/dL (ref 0.50–1.10)
GFR calc Af Amer: 82 mL/min — ABNORMAL LOW (ref >90)
GFR calc non Af Amer: 71 mL/min — ABNORMAL LOW (ref >90)
Glucose, Bld: 103 mg/dL — ABNORMAL HIGH (ref 70–99)

## 2012-04-20 LAB — DIFFERENTIAL
Basophils Relative: 0 % (ref 0–1)
Lymphs Abs: 4 10*3/uL (ref 0.7–4.0)
Monocytes Absolute: 1.7 10*3/uL — ABNORMAL HIGH (ref 0.1–1.0)
Monocytes Relative: 12 % (ref 3–12)
Neutro Abs: 7.5 10*3/uL (ref 1.7–7.7)

## 2012-04-20 LAB — COMPREHENSIVE METABOLIC PANEL
Albumin: 3.5 g/dL (ref 3.5–5.2)
BUN: 21 mg/dL (ref 6–23)
CO2: 23 mEq/L (ref 19–32)
Chloride: 96 mEq/L (ref 96–112)
Creatinine, Ser: 0.97 mg/dL (ref 0.50–1.10)
GFR calc Af Amer: 79 mL/min — ABNORMAL LOW (ref >90)
GFR calc non Af Amer: 68 mL/min — ABNORMAL LOW (ref >90)
Glucose, Bld: 98 mg/dL (ref 70–99)
Total Bilirubin: 0.3 mg/dL (ref 0.3–1.2)

## 2012-04-20 LAB — URINALYSIS, ROUTINE W REFLEX MICROSCOPIC
Ketones, ur: NEGATIVE mg/dL
Leukocytes, UA: NEGATIVE
Protein, ur: NEGATIVE mg/dL
Urobilinogen, UA: 0.2 mg/dL (ref 0.0–1.0)

## 2012-04-20 LAB — URINE MICROSCOPIC-ADD ON

## 2012-04-20 MED ORDER — POTASSIUM CHLORIDE CRYS ER 20 MEQ PO TBCR: 20.0000 meq | EXTENDED_RELEASE_TABLET | Freq: Every day | ORAL | Status: DC

## 2012-04-20 MED ORDER — POTASSIUM CHLORIDE CRYS ER 20 MEQ PO TBCR
40.0000 meq | EXTENDED_RELEASE_TABLET | Freq: Once | ORAL | Status: AC
  Administered 2012-04-20: 40 meq via ORAL
  Filled 2012-04-20: qty 2

## 2012-04-20 MED ORDER — SODIUM CHLORIDE 0.9 % IV SOLN
Freq: Once | INTRAVENOUS | Status: AC
  Administered 2012-04-20: 05:00:00 via INTRAVENOUS

## 2012-04-20 NOTE — Discharge Instructions (Signed)
Your potassium level is low (3.1) - eat plenty of fruits and vegetables, take potassium supplement as prescribed, and follow up with primary care doctor in 1 week. You may take tylenol/advil as need.  Also from the lab work, there is mild elevated of a couple of the liver function tests, AST 60, ALT 40, similar to prior results - follow up with primary care doctor.  Return to ER if worse, new symptoms, severe pain, other concern.      Hypokalemia Hypokalemia means a low potassium level in the blood.Potassium is an electrolyte that helps regulate the amount of fluid in the body. It also stimulates muscle contraction and maintains a stable acid-base balance.Most of the body's potassium is inside of cells, and only a very small amount is in the blood. Because the amount in the blood is so small, minor changes can have big effects. PREPARATION FOR TEST Testing for potassium requires taking a blood sample taken by needle from a vein in the arm. The skin is cleaned thoroughly before the sample is drawn. There is no other special preparation needed. NORMAL VALUES Potassium levels below 3.5 mEq/L are abnormally low. Levels above 5.1 mEq/L are abnormally high. Ranges for normal findings may vary among different laboratories and hospitals. You should always check with your doctor after having lab work or other tests done to discuss the meaning of your test results and whether your values are considered within normal limits. MEANING OF TEST  Your caregiver will go over the test results with you and discuss the importance and meaning of your results, as well as treatment options and the need for additional tests, if necessary. A potassium level is frequently part of a routine medical exam. It is usually included as part of a whole "panel" of tests for several blood salts (such as Sodium and Chloride). It may be done as part of follow-up when a low potassium level was found in the past or other blood salts  are suspected of being out of balance. A low potassium level might be suspected if you have one or more of the following:  Symptoms of weakness.   Abnormal heart rhythms.   High blood pressure and are taking medication to control this, especially water pills (diuretics).   Kidney disease that can affect your potassium level .   Diabetes requiring the use of insulin. The potassium may fall after taking insulin, especially if the diabetes had been out of control for a while.   A condition requiring the use of cortisone-type medication or certain types of antibiotics.   Vomiting and/or diarrhea for more than a day or two.   A stomach or intestinal condition that may not permit appropriate absorption of potassium.   Fainting episodes.   Mental confusion.  OBTAINING TEST RESULTS It is your responsibility to obtain your test results. Ask the lab or department performing the test when and how you will get your results.  Please contact your caregiver directly if you have not received the results within one week. At that time, ask if there is anything different or new you should be doing in relation to the results. TREATMENT Hypokalemia can be treated with potassium supplements taken by mouth and/or adjustments in your current medications. A diet high in potassium is also helpful. Foods with high potassium content are:  Peas, lentils, lima beans, nuts, and dried fruit.   Whole grain and bran cereals and breads.   Fresh fruit, vegetables (bananas, cantaloupe, grapefruit, oranges, tomatoes, honeydew  melons, potatoes).   Orange and tomato juices.   Meats. If potassium supplement has been prescribed for you today or your medications have been adjusted, see your personal caregiver in time02 for a re-check.  SEEK MEDICAL CARE IF:  There is a feeling of worsening weakness.   You experience repeated chest palpitations.   You are diabetic and having difficulty keeping your blood sugars in  the normal range.   You are experiencing vomiting and/or diarrhea.   You are having difficulty with any of your regular medications.  SEEK IMMEDIATE MEDICAL CARE IF:  You experience chest pain, shortness of breath, or episodes of dizziness.   You have been having vomiting or diarrhea for more than 2 days.   You have a fainting episode.  MAKE SURE YOU:   Understand these instructions.   Will watch your condition.   Will get help right away if you are not doing well or get worse.  Document Released: 11/17/2005 Document Revised: 11/06/2011 Document Reviewed: 10/28/2008 [facility] Patient Information 2012 [place], [place].

## 2012-04-20 NOTE — ED Notes (Signed)
Pt states that she has been cramping mostly on her left side for several days. She has a hx of Bell's palsy on that side and has had problems with the left arm ever since her doctor gave her a cortisone shot in her back.

## 2012-04-20 NOTE — ED Notes (Signed)
Pt reported [place] ing Bariatric Pre-op shakes without MD supervision stated that cramps and discomfort started after this diet change. MD notified.

## 2012-04-20 NOTE — ED Provider Notes (Addendum)
History     CSN: [id number]  Arrival date & time 04/20/12  0102   First MD Initiated Contact with Patient 04/20/12 [phone]      Chief Complaint  Patient presents with  . Body Cramping     (Consider location/radiation/quality/duration/timing/severity/associated sxs/prior treatment) The history is provided by the patient.  pt c/o bilateral leg and occasional bil arm cramping last night. States comes in spasms, feels like is having a muscle cramp or spasm, dull, painful. Lasts seconds to a couple minutes. Notes hx same, denies specific diagnosis. No persistent or constant cramping. Denies hx restless leg syndrome. States recent health at baseline. No recent febrile or viral illness. No headache. No neck or back pain. No numbness/paresthesias or weakness. No cp or sob. No abd pain. No nvd. No gu c/o. Denies recent change in meds. No fevers, no chills or sweats.   Past Medical History  Diagnosis Date  . Sarcoidosis   . ITP (idiopathic thrombocytopenic purpura)     Dr. Shirline Frees  . Hypertension   . Asthma   . Depression   . H/O Bell's palsy   . H/O: pneumonia   . Herniated disc, cervical     Past Surgical History  Procedure Date  . Tubal ligation   . Cholecystectomy   . 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 Cesarian section     x2  . Percutaneous liver biopsy     x2  . Left stapedectomy   . Bone marrow biopsy     12/2001    Family History  Problem Relation Age of Onset  . Adopted: Yes    History  Substance Use Topics  . Smoking status: Former Smoker -- 0.5 packs/day for 5 years    Types: Cigarettes  . Smokeless tobacco: Never Used  . Alcohol Use: No    OB History    Grav Para Term Preterm Abortions TAB SAB Ect Mult Living                  Review of Systems  Constitutional: Negative for fever and chills.  HENT: Negative for neck pain.   Eyes: Negative for visual disturbance.  Respiratory: Negative for shortness of breath.   Cardiovascular: Negative for  chest pain.  Gastrointestinal: Negative for abdominal pain.  Genitourinary: Negative for flank pain.  Musculoskeletal: Negative for back pain.  Skin: Negative for rash.  Neurological: Negative for weakness and headaches.  Hematological: Does not bruise/bleed easily.  Psychiatric/Behavioral: Negative for confusion.    Allergies  Avelox and Codeine  Home Medications   Current Outpatient Rx  Name Route Sig Dispense Refill  . ALBUTEROL SULFATE (2.5 MG/3ML) 0.083% IN NEBU Nebulization Take 2.5 mg by nebulization every 6 (six) hours as needed. For shortness of breath    . BUDESONIDE-FORMOTEROL FUMARATE 80-4.5 MCG/ACT IN AERO Inhalation Inhale 2 puffs into the lungs 2 (two) times daily.    . CHLORTHALIDONE 50 MG PO TABS Oral Take 50 mg by mouth daily.    Marland Kitchen CITALOPRAM HYDROBROMIDE 20 MG PO TABS Oral Take 20 mg by mouth daily.    . CYANOCOBALAMIN PO Oral Take 1 tablet by mouth daily.    Marland Kitchen MEDROXYPROGESTERONE ACETATE 10 MG PO TABS Oral Take 10 mg by mouth daily.    . ADULT MULTIVITAMIN W/MINERALS CH Oral Take 1 tablet by mouth daily.      BP 132/74  Pulse 81  Temp(Src) 98.6 F (37 C) (Oral)  Resp 20  SpO2 97%  LMP 03/21/2012  Physical Exam  Nursing note and vitals reviewed. Constitutional: She is oriented to person, place, and time. She appears well-developed and well-nourished. No distress.  HENT:  Head: Atraumatic.  Eyes: Conjunctivae are normal. No scleral icterus.  Neck: Neck supple. No tracheal deviation present.  Cardiovascular: Normal rate, regular rhythm, normal heart sounds and intact distal pulses.   Pulmonary/Chest: Effort normal and breath sounds normal. No respiratory distress.  Abdominal: Soft. Normal appearance and bowel sounds are normal. She exhibits no distension. There is no tenderness.  Musculoskeletal: She exhibits no edema and no tenderness.  Neurological: She is alert and oriented to person, place, and time.       Motor intact bil.   Skin: Skin is warm and  dry. No rash noted.  Psychiatric: She has a normal mood and affect.    ED Course  Procedures (including critical care time) Results for orders placed during the hospital encounter of 04/20/12  CBC      Component Value Range   WBC 13.5 (*) 4.0 - 10.5 (K/uL)   RBC 4.25  3.87 - 5.11 (MIL/uL)   Hemoglobin 12.7  12.0 - 15.0 (g/dL)   HCT 62.9  52.8 - 41.3 (%)   MCV 88.7  78.0 - 100.0 (fL)   MCH 29.9  26.0 - 34.0 (pg)   MCHC 33.7  30.0 - 36.0 (g/dL)   RDW 24.4  01.0 - 27.2 (%)   Platelets 433 (*) 150 - 400 (K/uL)  DIFFERENTIAL      Component Value Range   Neutrophils Relative 56  43 - 77 (%)   Neutro Abs 7.5  1.7 - 7.7 (K/uL)   Lymphocytes Relative 30  12 - 46 (%)   Lymphs Abs 4.0  0.7 - 4.0 (K/uL)   Monocytes Relative 12  3 - 12 (%)   Monocytes Absolute 1.7 (*) 0.1 - 1.0 (K/uL)   Eosinophils Relative 2  0 - 5 (%)   Eosinophils Absolute 0.2  0.0 - 0.7 (K/uL)   Basophils Relative 0  0 - 1 (%)   Basophils Absolute 0.1  0.0 - 0.1 (K/uL)  COMPREHENSIVE METABOLIC PANEL      Component Value Range   Sodium 133 (*) 135 - 145 (mEq/L)   Potassium 3.4 (*) 3.5 - 5.1 (mEq/L)   Chloride 96  96 - 112 (mEq/L)   CO2 23  19 - 32 (mEq/L)   Glucose, Bld 98  70 - 99 (mg/dL)   BUN 21  6 - 23 (mg/dL)   Creatinine, Ser 5.36  0.50 - 1.10 (mg/dL)   Calcium 9.1  8.4 - 64.4 (mg/dL)   Total Protein 8.4 (*) 6.0 - 8.3 (g/dL)   Albumin 3.5  3.5 - 5.2 (g/dL)   AST 60 (*) 0 - 37 (U/L)   ALT 40 (*) 0 - 35 (U/L)   Alkaline Phosphatase 276 (*) 39 - 117 (U/L)   Total Bilirubin 0.3  0.3 - 1.2 (mg/dL)   GFR calc non Af Amer 68 (*) >90 (mL/min)   GFR calc Af Amer 79 (*) >90 (mL/min)  URINALYSIS, ROUTINE W REFLEX MICROSCOPIC      Component Value Range   Color, Urine YELLOW  YELLOW    APPearance CLEAR  CLEAR    Specific Gravity, Urine 1.010  1.005 - 1.030    pH 6.0  5.0 - 8.0    Glucose, UA NEGATIVE  NEGATIVE (mg/dL)   Hgb urine dipstick MODERATE (*) NEGATIVE    Bilirubin Urine NEGATIVE  NEGATIVE  Ketones,  ur NEGATIVE  NEGATIVE (mg/dL)   Protein, ur NEGATIVE  NEGATIVE (mg/dL)   Urobilinogen, UA 0.2  0.0 - 1.0 (mg/dL)   Nitrite NEGATIVE  NEGATIVE    Leukocytes, UA NEGATIVE  NEGATIVE   URINE MICROSCOPIC-ADD ON      Component Value Range   Squamous Epithelial / LPF FEW (*) RARE    WBC, UA 0-2  <3 (WBC/hpf)   RBC / HPF 0-2  <3 (RBC/hpf)   Bacteria, UA FEW (*) RARE   CBC      Component Value Range   WBC 11.1 (*) 4.0 - 10.5 (K/uL)   RBC 4.16  3.87 - 5.11 (MIL/uL)   Hemoglobin 12.2  12.0 - 15.0 (g/dL)   HCT 16.1  09.6 - 04.5 (%)   MCV 86.8  78.0 - 100.0 (fL)   MCH 29.3  26.0 - 34.0 (pg)   MCHC 33.8  30.0 - 36.0 (g/dL)   RDW 40.9  81.1 - 91.4 (%)   Platelets 451 (*) 150 - 400 (K/uL)  BASIC METABOLIC PANEL      Component Value Range   Sodium 134 (*) 135 - 145 (mEq/L)   Potassium 3.1 (*) 3.5 - 5.1 (mEq/L)   Chloride 98  96 - 112 (mEq/L)   CO2 26  19 - 32 (mEq/L)   Glucose, Bld 103 (*) 70 - 99 (mg/dL)   BUN 19  6 - 23 (mg/dL)   Creatinine, Ser 7.82  0.50 - 1.10 (mg/dL)   Calcium 9.2  8.4 - 95.6 (mg/dL)   GFR calc non Af Amer 71 (*) >90 (mL/min)   GFR calc Af Amer 82 (*) >90 (mL/min)       MDM  Labs.   Reviewed nursing notes and prior charts for additional history.   k sl low, kcl po.   Recheck resting comfortably, no current spasms/pain/cramping noted.     Date: 04/20/2012  Rate: 75  Rhythm: normal sinus rhythm  QRS Axis: normal  Intervals: normal  ST/T Wave abnormalities: normal  Conduction Disutrbances:none  Narrative Interpretation:   Old EKG Reviewed: unchanged      Suzi Roots, MD 04/20/12 2130  Suzi Roots, MD 04/20/12 [phone]

## 2013-01-02 ENCOUNTER — Encounter (HOSPITAL_COMMUNITY): Payer: Self-pay | Admitting: Emergency Medicine

## 2013-01-02 ENCOUNTER — Emergency Department (HOSPITAL_COMMUNITY): Payer: Medicare Other

## 2013-01-02 ENCOUNTER — Emergency Department (HOSPITAL_COMMUNITY)
Admission: EM | Admit: 2013-01-02 | Discharge: 2013-01-02 | Disposition: A | Discharge status: Home / Self Care | Payer: Medicare Other | Attending: Emergency Medicine | Admitting: Emergency Medicine

## 2013-01-02 DIAGNOSIS — J40 Bronchitis, not specified as acute or chronic: Secondary | ICD-10-CM

## 2013-01-02 DIAGNOSIS — IMO0002 Reserved for concepts with insufficient information to code with codable children: Secondary | ICD-10-CM | POA: Insufficient documentation

## 2013-01-02 DIAGNOSIS — J3489 Other specified disorders of nose and nasal sinuses: Secondary | ICD-10-CM | POA: Insufficient documentation

## 2013-01-02 DIAGNOSIS — R0602 Shortness of breath: Secondary | ICD-10-CM | POA: Insufficient documentation

## 2013-01-02 DIAGNOSIS — Z8739 Personal history of other diseases of the musculoskeletal system and connective tissue: Secondary | ICD-10-CM | POA: Insufficient documentation

## 2013-01-02 DIAGNOSIS — J45909 Unspecified asthma, uncomplicated: Secondary | ICD-10-CM | POA: Insufficient documentation

## 2013-01-02 DIAGNOSIS — Z79899 Other long term (current) drug therapy: Secondary | ICD-10-CM | POA: Insufficient documentation

## 2013-01-02 DIAGNOSIS — F329 Major depressive disorder, single episode, unspecified: Secondary | ICD-10-CM | POA: Insufficient documentation

## 2013-01-02 DIAGNOSIS — Z8701 Personal history of pneumonia (recurrent): Secondary | ICD-10-CM | POA: Insufficient documentation

## 2013-01-02 DIAGNOSIS — J209 Acute bronchitis, unspecified: Secondary | ICD-10-CM | POA: Insufficient documentation

## 2013-01-02 DIAGNOSIS — Z8619 Personal history of other infectious and parasitic diseases: Secondary | ICD-10-CM | POA: Insufficient documentation

## 2013-01-02 DIAGNOSIS — Z8669 Personal history of other diseases of the nervous system and sense organs: Secondary | ICD-10-CM | POA: Insufficient documentation

## 2013-01-02 DIAGNOSIS — F3289 Other specified depressive episodes: Secondary | ICD-10-CM | POA: Insufficient documentation

## 2013-01-02 DIAGNOSIS — Z87891 Personal history of nicotine dependence: Secondary | ICD-10-CM | POA: Insufficient documentation

## 2013-01-02 DIAGNOSIS — Z862 Personal history of diseases of the blood and blood-forming organs and certain disorders involving the immune mechanism: Secondary | ICD-10-CM | POA: Insufficient documentation

## 2013-01-02 DIAGNOSIS — I10 Essential (primary) hypertension: Secondary | ICD-10-CM | POA: Insufficient documentation

## 2013-01-02 MED ORDER — ALBUTEROL SULFATE (5 MG/ML) 0.5% IN NEBU
2.5000 mg | INHALATION_SOLUTION | Freq: Once | RESPIRATORY_TRACT | Status: AC
  Administered 2013-01-02: 2.5 mg via RESPIRATORY_TRACT
  Filled 2013-01-02: qty 0.5

## 2013-01-02 MED ORDER — ALBUTEROL SULFATE HFA 108 (90 BASE) MCG/ACT IN AERS: 2.0000 | INHALATION_SPRAY | RESPIRATORY_TRACT | Status: DC | PRN

## 2013-01-02 MED ORDER — AMOXICILLIN-POT CLAVULANATE 875-125 MG PO TABS: 1.0000 | ORAL_TABLET | Freq: Two times a day (BID) | ORAL | Status: DC

## 2013-01-02 MED ORDER — PREDNISONE 20 MG PO TABS: 40.0000 mg | ORAL_TABLET | Freq: Every day | ORAL | Status: DC

## 2013-01-02 NOTE — ED Provider Notes (Addendum)
History     CSN: [id number]  Arrival date & time 01/02/13  0006   First MD Initiated Contact with Patient 01/02/13 0202      Chief Complaint  Patient presents with  . Shortness of Breath    (Consider location/radiation/quality/duration/timing/severity/associated sxs/prior treatment) HPI Comments: Female patient with a history of asthma and sarcoidosis who presents with a complaint of one week of coughing. This has been persistent, is non-productive at this time, today had associated increased shortness of breath with wheezing and a runny nose. Nothing seems to make this better or worse prior to arrival however on arrival received albuterol nebulizer treatment with significant improvement. She has been blowing green mucous out of her nose intermittently but not having any sinus pressure or fevers.  The history is provided by the patient and a relative.    Past Medical History  Diagnosis Date  . Sarcoidosis   . ITP (idiopathic thrombocytopenic purpura)     Dr. Shirline Frees  . Hypertension   . Asthma   . Depression   . H/O Bell's palsy   . H/O: pneumonia   . Herniated disc, cervical     Past Surgical History  Procedure Date  . Tubal ligation   . Cholecystectomy   . 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 Cesarian section     x2  . Percutaneous liver biopsy     x2  . Left stapedectomy   . Bone marrow biopsy     12/2001    Family History  Problem Relation Age of Onset  . Adopted: Yes    History  Substance Use Topics  . Smoking status: Former Smoker -- 0.5 packs/day for 5 years    Types: Cigarettes  . Smokeless tobacco: Never Used  . Alcohol Use: No    OB History    Grav Para Term Preterm Abortions TAB SAB Ect Mult Living                  Review of Systems  All other systems reviewed and are negative.    Allergies  Avelox and Codeine  Home Medications   Current Outpatient Rx  Name  Route  Sig  Dispense  Refill  . ALBUTEROL SULFATE (2.5 MG/3ML)  0.083% IN NEBU   Nebulization   Take 2.5 mg by nebulization every 6 (six) hours as needed. For shortness of breath         . BUDESONIDE-FORMOTEROL FUMARATE 80-4.5 MCG/ACT IN AERO   Inhalation   Inhale 2 puffs into the lungs 2 (two) times daily.         . CHLORTHALIDONE 50 MG PO TABS   Oral   Take 50 mg by mouth daily.         Marland Kitchen CITALOPRAM HYDROBROMIDE 20 MG PO TABS   Oral   Take 20 mg by mouth daily.         Marland Kitchen MEDROXYPROGESTERONE ACETATE 10 MG PO TABS   Oral   Take 10 mg by mouth daily.         . ALBUTEROL SULFATE HFA 108 (90 BASE) MCG/ACT IN AERS   Inhalation   Inhale 2 puffs into the lungs every 4 (four) hours as needed for wheezing or shortness of breath.   1 Inhaler   3   . AMOXICILLIN-POT CLAVULANATE 875-125 MG PO TABS   Oral   Take 1 tablet by mouth every 12 (twelve) hours.   14 tablet   0   . POTASSIUM CHLORIDE  CRYS ER 20 MEQ PO TBCR   Oral   Take 1 tablet (20 mEq total) by mouth daily.   20 tablet   0   . PREDNISONE 20 MG PO TABS   Oral   Take 2 tablets (40 mg total) by mouth daily.   10 tablet   0     BP 133/87  Pulse 105  Temp 98.6 F (37 C) (Oral)  Resp 20  SpO2 100%  LMP 12/19/2012  Physical Exam  Nursing note and vitals reviewed. Constitutional: She appears well-developed and well-nourished. No distress.  HENT:  Head: Normocephalic and atraumatic.  Mouth/Throat: Oropharynx is clear and moist. No oropharyngeal exudate.       Oropharynx is clear, nostrils with bilateral rhinorrhea  Eyes: Conjunctivae normal and EOM are normal. Pupils are equal, round, and reactive to light. Right eye exhibits no discharge. Left eye exhibits no discharge. No scleral icterus.  Neck: Normal range of motion. Neck supple. No JVD present. No thyromegaly present.  Cardiovascular: Normal rate, regular rhythm, normal heart sounds and intact distal pulses.  Exam reveals no gallop and no friction rub.   No murmur heard. Pulmonary/Chest: Effort normal. No  respiratory distress. She has wheezes (end expiratory wheezing bilaterally). She has no rales.       No increased work of breathing, no rales, speaks in full sentences  Abdominal: Soft. Bowel sounds are normal. She exhibits no distension and no mass. There is no tenderness.  Musculoskeletal: Normal range of motion. She exhibits no edema and no tenderness.  Lymphadenopathy:    She has no cervical adenopathy.  Neurological: She is alert. Coordination normal.  Skin: Skin is warm and dry. No rash noted. No erythema.  Psychiatric: She has a normal mood and affect. Her behavior is normal.    ED Course  Procedures (including critical care time)  Labs Reviewed - No data to display Dg Chest 2 View  01/02/2013  *RADIOLOGY REPORT*  Clinical Data: Shortness of breath.  Productive cough.  History sarcoidosis.  CHEST - 2 VIEW  Comparison: 06/05/2011  Findings: Normal heart size and pulmonary vascularity. Interstitial reticular nodular changes and peribronchial opacities compatible with history of sarcoidosis.  No focal consolidation. No pneumothorax.  Mediastinal contours appear intact.  No blunting of costophrenic angles.  No significant changes since the previous study.  IMPRESSION: Interstitial parenchymal disease with perihilar opacities compatible with history of sarcoidosis.  No active consolidation.   Original Report Authenticated By: Burman Nieves, M.D.      1. Bronchitis       MDM  The patient has upper respiratory infection, possibly sinusitis but due to her underlying asthma and sarcoid she will benefit from antibiotic therapy along with albuterol MDI to go home with.  PA and lateral views of the chest were obtained by digital radiography. I have personally interpreted these x-rays and find her to be no signs of pulmonary infiltrate, cardiomegaly, subdiaphragmatic free air, soft tissue abnormality, no obvious bony abnormalities or fractures.  There is a small amount of linear atelectasis of  the right lung  Patient appears stable, has improved significantly after receiving albuterol treatment. X-ray reviewed and shows no signs of acute infiltrate however with the patient's pre-existing conditions will treat with antibiotic and have followup closely. Patient expresses understanding.   ED ECG REPORT  I personally interpreted this EKG   Date: 01/02/2013   Rate: 94  Rhythm: normal sinus rhythm  QRS Axis: normal  Intervals: normal  ST/T Wave abnormalities: normal  Conduction Disutrbances:none  Narrative Interpretation: Poor R-wave progression  Old EKG Reviewed: Compared with 04/20/2012, no significant changes   Vida Roller, MD 01/02/13 4098  Vida Roller, MD 01/02/13 0730

## 2013-01-02 NOTE — ED Notes (Signed)
Patient is alert and oriented x3.  She was given DC instructions and follow up visit instructions.  Patient gave verbal understanding. She was DC ambulatory under her own power to home.  V/S stable.  sHe was not showing any signs of distress on DC 

## 2013-01-02 NOTE — ED Notes (Signed)
Pt alert, arrives from home, c/o dry cough, sob, onset was this week, resp even, mild labored, mild exp wheezes noted.

## 2013-01-02 NOTE — ED Notes (Signed)
Patient is alert and oriented x3.  She is complaining of shortness of breath.  Lung sound are rhonchial on the right side.  She denies any pain, lightheadedness Or dizziness.

## 2019-04-03 ENCOUNTER — Other Ambulatory Visit: Payer: Self-pay

## 2019-04-03 ENCOUNTER — Emergency Department (HOSPITAL_COMMUNITY): Payer: 59

## 2019-04-03 ENCOUNTER — Emergency Department (HOSPITAL_COMMUNITY)
Admission: EM | Admit: 2019-04-03 | Discharge: 2019-04-03 | Disposition: A | Discharge status: Home / Self Care | Payer: 59 | Attending: Emergency Medicine | Admitting: Emergency Medicine

## 2019-04-03 ENCOUNTER — Encounter (HOSPITAL_COMMUNITY): Payer: Self-pay

## 2019-04-03 DIAGNOSIS — Z87891 Personal history of nicotine dependence: Secondary | ICD-10-CM | POA: Diagnosis not present

## 2019-04-03 DIAGNOSIS — J45909 Unspecified asthma, uncomplicated: Secondary | ICD-10-CM | POA: Diagnosis not present

## 2019-04-03 DIAGNOSIS — H6692 Otitis media, unspecified, left ear: Secondary | ICD-10-CM | POA: Diagnosis not present

## 2019-04-03 DIAGNOSIS — M5412 Radiculopathy, cervical region: Secondary | ICD-10-CM | POA: Insufficient documentation

## 2019-04-03 DIAGNOSIS — G43909 Migraine, unspecified, not intractable, without status migrainosus: Secondary | ICD-10-CM | POA: Diagnosis present

## 2019-04-03 DIAGNOSIS — I160 Hypertensive urgency: Secondary | ICD-10-CM | POA: Diagnosis not present

## 2019-04-03 DIAGNOSIS — Z79899 Other long term (current) drug therapy: Secondary | ICD-10-CM | POA: Insufficient documentation

## 2019-04-03 LAB — CBC WITH DIFFERENTIAL/PLATELET
Abs Immature Granulocytes: 0.02 10*3/uL (ref 0.00–0.07)
Basophils Absolute: 0.1 10*3/uL (ref 0.0–0.1)
Basophils Relative: 1 %
Eosinophils Absolute: 0.4 10*3/uL (ref 0.0–0.5)
Eosinophils Relative: 4 %
HCT: 43.3 % (ref 36.0–46.0)
Hemoglobin: 14.6 g/dL (ref 12.0–15.0)
Immature Granulocytes: 0 %
Lymphocytes Relative: 36 %
Lymphs Abs: 4.1 10*3/uL — ABNORMAL HIGH (ref 0.7–4.0)
MCH: 31.4 pg (ref 26.0–34.0)
MCHC: 33.7 g/dL (ref 30.0–36.0)
MCV: 93.1 fL (ref 80.0–100.0)
Monocytes Absolute: 1.5 10*3/uL — ABNORMAL HIGH (ref 0.1–1.0)
Monocytes Relative: 13 %
Neutro Abs: 5.2 10*3/uL (ref 1.7–7.7)
Neutrophils Relative %: 46 %
Platelets: 347 10*3/uL (ref 150–400)
RBC: 4.65 MIL/uL (ref 3.87–5.11)
RDW: 15.2 % (ref 11.5–15.5)
WBC: 11.2 10*3/uL — ABNORMAL HIGH (ref 4.0–10.5)
nRBC: 0 % (ref 0.0–0.2)

## 2019-04-03 LAB — BASIC METABOLIC PANEL
Anion gap: 8 (ref 5–15)
BUN: 11 mg/dL (ref 6–20)
CO2: 28 mmol/L (ref 22–32)
Calcium: 9.1 mg/dL (ref 8.9–10.3)
Chloride: 104 mmol/L (ref 98–111)
Creatinine, Ser: 0.74 mg/dL (ref 0.44–1.00)
GFR calc Af Amer: >60 mL/min (ref >60)
GFR calc non Af Amer: >60 mL/min (ref >60)
Glucose, Bld: 109 mg/dL — ABNORMAL HIGH (ref 70–99)
Potassium: 3.9 mmol/L (ref 3.5–5.1)
Sodium: 140 mmol/L (ref 135–145)

## 2019-04-03 MED ORDER — CIPROFLOXACIN-DEXAMETHASONE 0.3-0.1 % OT SUSP
4.0000 [drp] | Freq: Two times a day (BID) | OTIC | Status: DC
  Administered 2019-04-03: 18:00:00 4 [drp] via OTIC
  Filled 2019-04-03: qty 7.5

## 2019-04-03 MED ORDER — HYDROCODONE-ACETAMINOPHEN 5-325 MG PO TABS: 1.0000 | ORAL_TABLET | Freq: Four times a day (QID) | ORAL | 0 refills | Status: DC | PRN

## 2019-04-03 MED ORDER — AMOXICILLIN-POT CLAVULANATE 875-125 MG PO TABS: 1.0000 | ORAL_TABLET | Freq: Two times a day (BID) | ORAL | 0 refills | Status: AC

## 2019-04-03 MED ORDER — HYDROMORPHONE HCL 1 MG/ML IJ SOLN
1.0000 mg | Freq: Once | INTRAMUSCULAR | Status: AC
  Administered 2019-04-03: 16:00:00 1 mg via INTRAVENOUS
  Filled 2019-04-03: qty 1

## 2019-04-03 MED ORDER — TIZANIDINE HCL 4 MG PO TABS: 4.0000 mg | ORAL_TABLET | Freq: Three times a day (TID) | ORAL | 0 refills | Status: DC | PRN

## 2019-04-03 MED ORDER — ONDANSETRON HCL 4 MG/2ML IJ SOLN
4.0000 mg | Freq: Once | INTRAMUSCULAR | Status: AC
  Administered 2019-04-03: 16:00:00 4 mg via INTRAVENOUS
  Filled 2019-04-03: qty 2

## 2019-04-03 NOTE — ED Triage Notes (Signed)
Patient reports that she went to [facility] today for an ear infection. Patient states she was told to come to the ED for hypertension and migraine .  Patient also c/o left hand pain and rash on the left hand.

## 2019-04-03 NOTE — ED Provider Notes (Signed)
Assumed care from Dr. Ellender Hose, patient's CT scan is without acute emergent findings, otitis externa to be treated with both oral and droplet antibiotics.  Appropriate for discharge.  After the discussed management above, the patient was determined to be safe for discharge.  The patient was in agreement with this plan and all questions regarding their care were answered.  ED return precautions were discussed and the patient will return to the ED with any significant worsening of condition.  [name]. Sedonia Small, [place] [email or web address]    Maudie Flakes, MD 04/03/19 [phone]

## 2019-04-03 NOTE — Discharge Instructions (Addendum)
Your CT scan did not show any emergencies, but did confirm that you have an ear infection.  For your ear: -Take the prescribed oral antibiotic AND use your ear drops twice a day for 7 days  For your blood pressure: -I suspect this is from pain. Take the Hydrocodone as prescribed for pain and follow-up with your Spine specialist.

## 2019-04-03 NOTE — ED Provider Notes (Signed)
[facility] DEPT Provider Note   CSN: [id number] Arrival date & time: 04/03/19  1446    History   Chief Complaint Chief Complaint  Patient presents with   Hypertension   Migraine   multiple complaints    HPI [name] is a 53 y.o. female.     HPI   53 yo F with PMHx below here with headache, ear pain.   Pt reports that over the past several days, she's had progressively worsening aching, throbbing, left ear pain. She wears a hearing aid and noticed pain with placement of it in her ear with associated mild drainage. She also states that over the last 24 hours or so, she's had gradual onset of diffuse, generalized headache. No photophobia or phonophobia. No fevers, chills, neck pain or neck stiffness. She went to UC today and was diagnosed with AOM, but was sent to the ED due to concern for her elevated blood pressure. No focal numbness or weakness.  Pt also states that she has chronic neck pain 2/2 radiculopathy. She has increased pain in her b/l hands which is tingling, shooting, and burning. She has an appt with her spine specialist for this and admits that when her pain is bad, she often has headaches. No overt neck stiffness. No fevers as mentioned. No LE weakness or numbness.  Past Medical History:  Diagnosis Date   Asthma    Depression    H/O Bell's palsy    H/O: pneumonia    Herniated disc, cervical    Hypertension    ITP (idiopathic thrombocytopenic purpura)    Dr. Earlie Server   Sarcoidosis     Patient Active Problem List   Diagnosis Date Noted   Pleural effusion 01/13/2012   Bronchospasm 01/08/2012   OSA on CPAP 01/08/2012   Pancreatitis 01/07/2012   Elevated LFTs 01/07/2012   Cough 01/07/2012   Hyperproteinemia 01/07/2012   Hypoalbuminemia 01/07/2012   Leukocytosis 01/07/2012   Hyponatremia 01/07/2012   Community acquired pneumonia 01/07/2012   Hypertension    Depression    SARCOIDOSIS (ANY SITE)  09/21/2008   DYSPNEA 09/21/2008    Past Surgical History:  Procedure Laterality Date   BACK SURGERY     BONE MARROW BIOPSY     12/2001   Cesarian section     x2   CHOLECYSTECTOMY     LAPAROSCOPIC SPLENECTOMY     04/05/2009   Left stapedectomy     PERCUTANEOUS LIVER BIOPSY     x2   TUBAL LIGATION       OB History   No obstetric history on file.      Home Medications    Prior to Admission medications   Medication Sig Start Date End Date Taking? Authorizing Provider  albuterol (PROVENTIL HFA;VENTOLIN HFA) 108 (90 BASE) MCG/ACT inhaler Inhale 2 puffs into the lungs every 4 (four) hours as needed for wheezing or shortness of breath. 01/02/13  Yes Noemi Chapel, MD  albuterol (PROVENTIL) (2.5 MG/3ML) 0.083% nebulizer solution Take 2.5 mg by nebulization every 6 (six) hours as needed. For shortness of breath   Yes [provider]  allopurinol (ZYLOPRIM) 300 MG tablet Take 300 mg by mouth daily as needed (gout flare up.).  08/14/17  Yes [provider]  citalopram (CELEXA) 20 MG tablet Take 20 mg by mouth daily.   Yes [provider]  colchicine 0.6 MG tablet Take 0.6 mg by mouth daily as needed (gout flare up.).  02/03/18  Yes [provider]  Fluticasone-Umeclidin-Vilant 100-62.5-25 MCG/INH AEPB Inhale 1 puff into the lungs daily. 04/01/17  Yes [provider]  gabapentin (NEURONTIN) 300 MG capsule Take 300 mg by mouth 3 (three) times daily. 03/15/19  Yes [provider]  loratadine (CLARITIN) 10 MG tablet Take 10 mg by mouth daily. 06/09/18 08/10/19 Yes [provider]  Multiple Vitamins-Minerals (CENTRUM SILVER 50+WOMEN PO) Take 1 tablet by mouth at bedtime.   Yes [provider]  omeprazole (PRILOSEC) 40 MG capsule Take 40 mg by mouth daily. 04/10/17  Yes [provider]  tiZANidine (ZANAFLEX) 4 MG tablet Take 4 mg by mouth every 8 (eight) hours as needed for muscle spasms. 08/25/18  Yes [provider]  amoxicillin-clavulanate (AUGMENTIN) 875-125 MG tablet Take 1 tablet by mouth every 12 (twelve) hours for 10 days. 04/03/19 04/13/19  Duffy Bruce, MD  potassium chloride SA (K-DUR,KLOR-CON) 20 MEQ tablet Take 1 tablet (20 mEq total) by mouth daily. 04/20/12 04/20/13  Lajean Saver, MD  predniSONE (DELTASONE) 20 MG tablet Take 2 tablets (40 mg total) by mouth daily. Patient not taking: Reported on 04/03/2019 01/02/13   Noemi Chapel, MD  tiZANidine (ZANAFLEX) 4 MG tablet Take 1 tablet (4 mg total) by mouth every 8 (eight) hours as needed for muscle spasms. 04/03/19   Maudie Flakes, MD    Family History Family History  Adopted: Yes    Social History Social History   Tobacco Use   Smoking status: Former Smoker    Packs/day: 0.50    Years: 5.00    Pack years: 2.50    Types: Cigarettes   Smokeless tobacco: Never Used  Substance Use Topics   Alcohol use: No   Drug use: No     Allergies   Avelox [moxifloxacin hcl in nacl] and Codeine   Review of Systems Review of Systems  Constitutional: Negative for chills and fever.  HENT: Positive for ear discharge and ear pain. Negative for congestion, rhinorrhea and sore throat.   Eyes: Negative for visual disturbance.  Respiratory: Negative for cough, shortness of breath and wheezing.   Cardiovascular: Negative for chest pain and leg swelling.  Gastrointestinal: Negative for abdominal pain, diarrhea, nausea and vomiting.  Genitourinary: Negative for dysuria, flank pain, vaginal bleeding and vaginal discharge.  Musculoskeletal: Positive for neck pain and neck stiffness.  Skin: Negative for rash.  Allergic/Immunologic: Negative for immunocompromised state.  Neurological: Positive for numbness and headaches. Negative for syncope.  Hematological: Does not bruise/bleed easily.  All other systems reviewed and are negative.    Physical Exam Updated Vital Signs BP (!) 153/92    Pulse 79    Temp 98.6 F (37 C) (Oral)    Resp  10    Ht '5\' 5"'  (1.651 m)    Wt 127 kg    SpO2 93%    BMI 46.59 kg/m   Physical Exam Vitals signs and nursing note reviewed.  Constitutional:      General: She is not in acute distress.    Appearance: She is well-developed.  HENT:     Head: Normocephalic and atraumatic.     Comments: Left TM erythematous, bulging, with opacification and loss of light reflex. Mild edema along anterior EAC without drainage. No mastoid TTP. Eyes:     Conjunctiva/sclera: Conjunctivae normal.  Neck:     Musculoskeletal: Neck supple.     Comments: No midline TTP. No neck stiffness or rigidity.  Cardiovascular:     Rate and Rhythm: Normal rate and regular rhythm.  Heart sounds: Normal heart sounds. No murmur. No friction rub.  Pulmonary:     Effort: Pulmonary effort is normal. No respiratory distress.     Breath sounds: Normal breath sounds. No wheezing or rales.  Abdominal:     General: There is no distension.     Palpations: Abdomen is soft.     Tenderness: There is no abdominal tenderness.  Skin:    General: Skin is warm.     Capillary Refill: Capillary refill takes less than 2 seconds.  Neurological:     Mental Status: She is alert and oriented to person, place, and time.     Motor: No abnormal muscle tone.     Neurological Exam:  Mental Status: Alert and oriented to person, place, and time. Attention and concentration normal. Speech clear. Recent memory is intact. Cranial Nerves: Visual fields grossly intact. EOMI and PERRLA. No nystagmus noted. Facial sensation intact at forehead, maxillary cheek, and chin/mandible bilaterally. No facial asymmetry or weakness. Hearing grossly normal. Uvula is midline, and palate elevates symmetrically. Normal SCM and trapezius strength. Tongue midline without fasciculations. Motor: Muscle strength 5/5 in proximal and distal UE and LE bilaterally. No pronator drift. Muscle tone normal. Reflexes: 2+ and symmetrical in all four extremities.  Sensation: Intact to  light touch in upper and lower extremities distally bilaterally.  Gait: Normal without ataxia. Coordination: Normal FTN bilaterally.      ED Treatments / Results  Labs (all labs ordered are listed, but only abnormal results are displayed) Labs Reviewed  CBC WITH DIFFERENTIAL/PLATELET - Abnormal; Notable for the following components:      Result Value   WBC 11.2 (*)    Lymphs Abs 4.1 (*)    Monocytes Absolute 1.5 (*)    All other components within normal limits  BASIC METABOLIC PANEL - Abnormal; Notable for the following components:   Glucose, Bld 109 (*)    All other components within normal limits    EKG EKG Interpretation  Date/Time:  Sunday Apr 03 2019 16:07:50 EDT Ventricular Rate:  75 PR Interval:    QRS Duration: 110 QT Interval:  418 QTC Calculation: 467 R Axis:   100 Text Interpretation:  Sinus rhythm Probable anteroseptal infarct, recent No significant change since last tracing Confirmed by Duffy Bruce [phone]) on 04/03/2019 4:15:13 PM   Radiology Ct Head Wo Contrast  Result Date: 04/03/2019 CLINICAL DATA:  Headache and hypertension. Left ear infection with ear canal edema. EXAM: CT HEAD AND TEMPORAL BONES WITHOUT CONTRAST TECHNIQUE: Contiguous axial images were obtained from the base of the skull through the vertex without contrast. Multidetector CT imaging of the temporal bones was performed using the standard protocol without intravenous contrast. COMPARISON:  Cervical spine CT 03/30/2019. Head CT 05/31/2008. FINDINGS: CT HEAD FINDINGS Brain: There is no evidence of acute infarct, intracranial hemorrhage, mass, midline shift, or extra-axial fluid collection. A punctate calcification is noted superficially in the left frontal lobe. The ventricles and sulci are normal in size. Vascular: Calcified atherosclerosis at the skull base. No hyperdense vessel. Skull: No fracture or focal osseous lesion. Sinuses/Orbits: Visualized paranasal sinuses are clear. Orbits are  unremarkable. Other: None. CT TEMPORAL BONES FINDINGS RIGHT: The external auditory canal is unremarkable. The ossicles appear intact. The tympanic cavity is clear. The internal auditory canal, cochlea, vestibule, and semicircular canals are unremarkable. The mastoid air cells are clear. LEFT: There is moderate thickening of the soft tissues of the external auditory canal extending medially to the tympanic membrane without associated osseous destruction.  A small amount of strandy soft tissue is present in the tympanic cavity. A stapes prosthesis is in place, terminating in the oval window. The malleus and incus appear intact. The internal auditory canal, cochlea, vestibule, and semicircular canals are unremarkable. The mastoid air cells are clear. Calcification is noted superficially in the left parotid gland. IMPRESSION: 1. No evidence of acute intracranial abnormality. 2. Left otitis externa without bone destruction. Clear mastoid air cells. Electronically Signed   By: Logan Bores M.D.   On: 04/03/2019 17:21   Ct Temporal Bones Wo Contrast  Result Date: 04/03/2019 CLINICAL DATA:  Headache and hypertension. Left ear infection with ear canal edema. EXAM: CT HEAD AND TEMPORAL BONES WITHOUT CONTRAST TECHNIQUE: Contiguous axial images were obtained from the base of the skull through the vertex without contrast. Multidetector CT imaging of the temporal bones was performed using the standard protocol without intravenous contrast. COMPARISON:  Cervical spine CT 03/30/2019. Head CT 05/31/2008. FINDINGS: CT HEAD FINDINGS Brain: There is no evidence of acute infarct, intracranial hemorrhage, mass, midline shift, or extra-axial fluid collection. A punctate calcification is noted superficially in the left frontal lobe. The ventricles and sulci are normal in size. Vascular: Calcified atherosclerosis at the skull base. No hyperdense vessel. Skull: No fracture or focal osseous lesion. Sinuses/Orbits: Visualized paranasal  sinuses are clear. Orbits are unremarkable. Other: None. CT TEMPORAL BONES FINDINGS RIGHT: The external auditory canal is unremarkable. The ossicles appear intact. The tympanic cavity is clear. The internal auditory canal, cochlea, vestibule, and semicircular canals are unremarkable. The mastoid air cells are clear. LEFT: There is moderate thickening of the soft tissues of the external auditory canal extending medially to the tympanic membrane without associated osseous destruction. A small amount of strandy soft tissue is present in the tympanic cavity. A stapes prosthesis is in place, terminating in the oval window. The malleus and incus appear intact. The internal auditory canal, cochlea, vestibule, and semicircular canals are unremarkable. The mastoid air cells are clear. Calcification is noted superficially in the left parotid gland. IMPRESSION: 1. No evidence of acute intracranial abnormality. 2. Left otitis externa without bone destruction. Clear mastoid air cells. Electronically Signed   By: Logan Bores M.D.   On: 04/03/2019 17:21    Procedures Procedures (including critical care time)  Medications Ordered in ED Medications  HYDROmorphone (DILAUDID) injection 1 mg (1 mg Intravenous Given 04/03/19 1610)  ondansetron (ZOFRAN) injection 4 mg (4 mg Intravenous Given 04/03/19 1609)     Initial Impression / Assessment and Plan / ED Course  I have reviewed the triage vital signs and the nursing notes.  Pertinent labs & imaging results that were available during my care of the patient were reviewed by me and considered in my medical decision making (see chart for details).  Clinical Course as of Apr 03 709  Sun Apr 03, 2019  1608 53 yo F here with multiple complaints. Suspect her primary issue is AOM/OE of left ear 2/2 her hearing aid usage, with subsequent pain and hypertension causing generalized HA. No focal neuro deficits. She has no mastoid TTP or erythema. No fever, chills, or signs to  suggest meningitis or encephalitis. Pt also has chronic component of cervical radiculopathy which is also likely contributing. Given that I suspect her HTN is 2/2 pain, will give analgesics in addition to obtaining CT. Will have dedicated temporal imaging given her AOM and age, h/o DM.    [CI]  1706 Sarcoid history here with OM painful ear, sent here for  high BP from urgent care--just needs fu CT to exclude bone involvement, already has abx sent to pharmacy    [MB]    Clinical Course User Index [CI] Duffy Bruce, MD [MB] Maudie Flakes, MD        Final Clinical Impressions(s) / ED Diagnoses   Final diagnoses:  Acute otitis media, left  Hypertensive urgency  Cervical radiculopathy    ED Discharge Orders         Ordered    amoxicillin-clavulanate (AUGMENTIN) 875-125 MG tablet  Every 12 hours     04/03/19 1655    HYDROcodone-acetaminophen (NORCO/VICODIN) 5-325 MG tablet  Every 6 hours PRN,   Status:  Discontinued     04/03/19 1655    tiZANidine (ZANAFLEX) 4 MG tablet  Every 8 hours PRN     04/03/19 1752           Duffy Bruce, MD 04/04/19 [phone]

## 2019-04-10 ENCOUNTER — Other Ambulatory Visit: Payer: Self-pay

## 2019-04-10 ENCOUNTER — Encounter (HOSPITAL_COMMUNITY): Payer: Self-pay

## 2019-04-10 ENCOUNTER — Emergency Department (HOSPITAL_COMMUNITY)
Admission: EM | Admit: 2019-04-10 | Discharge: 2019-04-10 | Disposition: A | Discharge status: Home / Self Care | Payer: 59 | Attending: Emergency Medicine | Admitting: Emergency Medicine

## 2019-04-10 ENCOUNTER — Emergency Department (HOSPITAL_COMMUNITY): Payer: 59

## 2019-04-10 DIAGNOSIS — J45901 Unspecified asthma with (acute) exacerbation: Secondary | ICD-10-CM | POA: Insufficient documentation

## 2019-04-10 DIAGNOSIS — I10 Essential (primary) hypertension: Secondary | ICD-10-CM | POA: Diagnosis not present

## 2019-04-10 DIAGNOSIS — R0602 Shortness of breath: Secondary | ICD-10-CM | POA: Diagnosis present

## 2019-04-10 DIAGNOSIS — Z79899 Other long term (current) drug therapy: Secondary | ICD-10-CM | POA: Insufficient documentation

## 2019-04-10 DIAGNOSIS — Z20828 Contact with and (suspected) exposure to other viral communicable diseases: Secondary | ICD-10-CM | POA: Insufficient documentation

## 2019-04-10 DIAGNOSIS — Z87891 Personal history of nicotine dependence: Secondary | ICD-10-CM | POA: Diagnosis not present

## 2019-04-10 LAB — SARS CORONAVIRUS 2 BY RT PCR (HOSPITAL ORDER, PERFORMED IN ~~LOC~~ HOSPITAL LAB): SARS Coronavirus 2: NEGATIVE

## 2019-04-10 MED ORDER — IPRATROPIUM-ALBUTEROL 20-100 MCG/ACT IN AERS
2.0000 | INHALATION_SPRAY | Freq: Four times a day (QID) | RESPIRATORY_TRACT | Status: DC
  Administered 2019-04-10: 2 via RESPIRATORY_TRACT
  Filled 2019-04-10: qty 4

## 2019-04-10 MED ORDER — ALBUTEROL (5 MG/ML) CONTINUOUS INHALATION SOLN: 15.0000 mg/h | INHALATION_SOLUTION | RESPIRATORY_TRACT | Status: DC

## 2019-04-10 MED ORDER — IPRATROPIUM BROMIDE 0.02 % IN SOLN: 1.0000 mg | Freq: Once | RESPIRATORY_TRACT | Status: DC

## 2019-04-10 MED ORDER — PREDNISONE 20 MG PO TABS: 40.0000 mg | ORAL_TABLET | Freq: Every day | ORAL | 0 refills | Status: DC

## 2019-04-10 MED ORDER — ALBUTEROL (5 MG/ML) CONTINUOUS INHALATION SOLN
10.0000 mg/h | INHALATION_SOLUTION | RESPIRATORY_TRACT | Status: DC
  Administered 2019-04-10: 10 mg/h via RESPIRATORY_TRACT
  Filled 2019-04-10: qty 20

## 2019-04-10 MED ORDER — PREDNISONE 20 MG PO TABS
60.0000 mg | ORAL_TABLET | Freq: Once | ORAL | Status: AC
Start: 2019-04-10 — End: 2019-04-10
  Administered 2019-04-10: 60 mg via ORAL
  Filled 2019-04-10: qty 3

## 2019-04-10 MED ORDER — ALBUTEROL SULFATE HFA 108 (90 BASE) MCG/ACT IN AERS
4.0000 | INHALATION_SPRAY | Freq: Once | RESPIRATORY_TRACT | Status: AC
  Administered 2019-04-10: 4 via RESPIRATORY_TRACT
  Filled 2019-04-10: qty 6.7

## 2019-04-10 NOTE — ED Triage Notes (Signed)
Since Wednesday with greenish secretions given antibiotics and still wheezing and taking breathing treatments at home audiable wheezing noted. Hard to speak in full sentences.

## 2019-04-11 NOTE — ED Provider Notes (Signed)
[facility] DEPT Provider Note   CSN: [id number] Arrival date & time: 04/10/19  1607    History   Chief Complaint Chief Complaint  Patient presents with  . Shortness of Breath    HPI [name] is a 53 y.o. female.     HPI   71yF with dyspnea. Worsening over the past 1-2 day. Occasionally productive cough. No fever. No unusual leg pain or swelling. No n/v. Currently still on abx for recently diagnosed ear infection no sick contacts that she is aware of. Denies acute gi complaints.    Past Medical History:  Diagnosis Date  . Asthma   . Depression   . H/O Bell's palsy   . H/O: pneumonia   . Herniated disc, cervical   . Hypertension   . ITP (idiopathic thrombocytopenic purpura)    Dr. Earlie Server  . Sarcoidosis     Patient Active Problem List   Diagnosis Date Noted  . Pleural effusion 01/13/2012  . Bronchospasm 01/08/2012  . OSA on CPAP 01/08/2012  . Pancreatitis 01/07/2012  . Elevated LFTs 01/07/2012  . Cough 01/07/2012  . Hyperproteinemia 01/07/2012  . Hypoalbuminemia 01/07/2012  . Leukocytosis 01/07/2012  . Hyponatremia 01/07/2012  . Community acquired pneumonia 01/07/2012  . Hypertension   . Depression   . SARCOIDOSIS (ANY SITE) 09/21/2008  . DYSPNEA 09/21/2008    Past Surgical History:  Procedure Laterality Date  . BACK SURGERY    . BONE MARROW BIOPSY     12/2001  . Cesarian section     x2  . CHOLECYSTECTOMY    . LAPAROSCOPIC SPLENECTOMY     04/05/2009  . Left stapedectomy    . PERCUTANEOUS LIVER BIOPSY     x2  . TUBAL LIGATION       OB History   No obstetric history on file.      Home Medications    Prior to Admission medications   Medication Sig Start Date End Date Taking? Authorizing Provider  albuterol (PROVENTIL HFA;VENTOLIN HFA) 108 (90 BASE) MCG/ACT inhaler Inhale 2 puffs into the lungs every 4 (four) hours as needed for wheezing or shortness of breath. 01/02/13   Noemi Chapel, MD  albuterol  (PROVENTIL) (2.5 MG/3ML) 0.083% nebulizer solution Take 2.5 mg by nebulization every 6 (six) hours as needed. For shortness of breath    [provider]  allopurinol (ZYLOPRIM) 300 MG tablet Take 300 mg by mouth daily as needed (gout flare up.).  08/14/17   [provider]  amoxicillin-clavulanate (AUGMENTIN) 875-125 MG tablet Take 1 tablet by mouth every 12 (twelve) hours for 10 days. 04/03/19 04/13/19  Duffy Bruce, MD  citalopram (CELEXA) 20 MG tablet Take 20 mg by mouth daily.    [provider]  colchicine 0.6 MG tablet Take 0.6 mg by mouth daily as needed (gout flare up.).  02/03/18   [provider]  Fluticasone-Umeclidin-Vilant 100-62.5-25 MCG/INH AEPB Inhale 1 puff into the lungs daily. 04/01/17   [provider]  gabapentin (NEURONTIN) 300 MG capsule Take 300 mg by mouth 3 (three) times daily. 03/15/19   [provider]  loratadine (CLARITIN) 10 MG tablet Take 10 mg by mouth daily. 06/09/18 08/10/19  [provider]  Multiple Vitamins-Minerals (CENTRUM SILVER 50+WOMEN PO) Take 1 tablet by mouth at bedtime.    [provider]  omeprazole (PRILOSEC) 40 MG capsule Take 40 mg by mouth daily. 04/10/17   [provider]  potassium chloride SA (K-DUR,KLOR-CON) 20 MEQ tablet Take 1 tablet (20  mEq total) by mouth daily. 04/20/12 04/20/13  Lajean Saver, MD  predniSONE (DELTASONE) 20 MG tablet Take 2 tablets (40 mg total) by mouth daily. 04/10/19   Virgel Manifold, MD  tiZANidine (ZANAFLEX) 4 MG tablet Take 4 mg by mouth every 8 (eight) hours as needed for muscle spasms. 08/25/18   [provider]  tiZANidine (ZANAFLEX) 4 MG tablet Take 1 tablet (4 mg total) by mouth every 8 (eight) hours as needed for muscle spasms. 04/03/19   Maudie Flakes, MD    Family History Family History  Adopted: Yes    Social History Social History   Tobacco Use  . Smoking status: Former Smoker    Packs/day: 0.50    Years: 5.00    Pack  years: 2.50    Types: Cigarettes  . Smokeless tobacco: Never Used  Substance Use Topics  . Alcohol use: No  . Drug use: No     Allergies   Avelox [moxifloxacin hcl in nacl] and Codeine   Review of Systems Review of Systems  All systems reviewed and negative, other than as noted in HPI.  Physical Exam Updated Vital Signs BP (!) 145/86   Pulse (!) 117   Temp 98.6 F (37 C) (Oral)   Resp 20   Ht '5\' 5"'  (1.651 m)   Wt 127 kg   SpO2 95%   BMI 46.59 kg/m   Physical Exam Vitals signs and nursing note reviewed.  Constitutional:      General: She is not in acute distress.    Appearance: She is well-developed. She is obese.  HENT:     Head: Normocephalic and atraumatic.  Eyes:     General:        Right eye: No discharge.        Left eye: No discharge.     Conjunctiva/sclera: Conjunctivae normal.  Neck:     Musculoskeletal: Neck supple.  Cardiovascular:     Rate and Rhythm: Normal rate and regular rhythm.     Heart sounds: Normal heart sounds. No murmur. No friction rub. No gallop.   Pulmonary:     Effort: Pulmonary effort is normal. No respiratory distress.     Breath sounds: Wheezing present.  Abdominal:     General: There is no distension.     Palpations: Abdomen is soft.     Tenderness: There is no abdominal tenderness.  Musculoskeletal:        General: No tenderness.  Skin:    General: Skin is warm and dry.  Neurological:     Mental Status: She is alert.  Psychiatric:        Behavior: Behavior normal.        Thought Content: Thought content normal.      ED Treatments / Results  Labs (all labs ordered are listed, but only abnormal results are displayed) Labs Reviewed  SARS CORONAVIRUS 2 (HOSPITAL ORDER, [facility])    EKG None  Radiology Dg Chest 2 View  Result Date: 04/10/2019 CLINICAL DATA:  Shortness of breath, cough EXAM: CHEST - 2 VIEW COMPARISON:  01/02/2013 FINDINGS: Upper lobe predominant subtle scarring and  mild diffuse interstitial pulmonary opacity. There is no acute or focal appearing airspace opacity. IMPRESSION: Upper lobe predominant subtle scarring and mild diffuse interstitial pulmonary opacity. There is no acute or focal appearing airspace opacity. CT may be used for evaluation of interstitial lung disease if desired. Electronically Signed   By: Eddie Candle M.D.   On:  04/10/2019 17:02    Procedures Procedures (including critical care time)  Medications Ordered in ED Medications  albuterol (VENTOLIN HFA) 108 (90 Base) MCG/ACT inhaler 4 puff (4 puffs Inhalation Given 04/10/19 1711)  predniSONE (DELTASONE) tablet 60 mg (60 mg Oral Given 04/10/19 1807)     Initial Impression / Assessment and Plan / ED Course  I have reviewed the triage vital signs and the nursing notes.  Pertinent labs & imaging results that were available during my care of the patient were reviewed by me and considered in my medical decision making (see chart for details).        63yF with asthma exacerbation. covid negative. Not hypoxic. I think she is appropriate for continued outpt tx. Steroids. Elizebeth Koller out previously prescribed abx. It has been determined that no acute conditions requiring further emergency intervention are present at this time. The patient has been advised of the diagnosis and plan. I reviewed any labs and imaging including any potential incidental findings. I have reviewed nursing notes and appropriate previous records. We have discussed signs and symptoms that warrant return to the ED and they are listed in the discharge instructions.    Jazira Maloney was evaluated in Emergency Department on 04/12/2019 for the symptoms described in the history of present illness. She was evaluated in the context of the global COVID-19 pandemic, which necessitated consideration that the patient might be at risk for infection with the SARS-CoV-2 virus that causes COVID-19. Institutional protocols and algorithms that  pertain to the evaluation of patients at risk for COVID-19 are in a state of rapid change based on information released by regulatory bodies including the CDC and federal and state organizations. These policies and algorithms were followed during the patient's care in the ED.   Final Clinical Impressions(s) / ED Diagnoses   Final diagnoses:  Exacerbation of asthma, unspecified asthma severity, unspecified whether persistent    ED Discharge Orders         Ordered    predniSONE (DELTASONE) 20 MG tablet  Daily     04/10/19 2246           Virgel Manifold, MD 04/12/19 1745

## 2020-04-05 NOTE — H&P (Signed)
Subjective:     Patient ID: [name] is a 54 y.o. female.  HPI  Patient presents for follow up discussion prior to planned breast reduction. Current 46 DDD. Notes several year history neck and back pain. She has had two spinal surgeries, notes post surgery can feel weight of breast pulling her forward. Has tried specialty fitted bras, OTC pain mediation (does not take NSAIDS due to history ITP), steroids, Lyrica, hot/cold packs without relief. Notes bilateral numbness hands greater on left. Notes she is unable to walk or stand for prolonged periods due to pain. Denies rashes  Underwent sleeve gastrectomy 2018, prior to this weight 335 lb.  Wt fluctuates between 250-290 lb, lost 10 lb since last visit here, on plant based diet.   MMG 01/2020 normal. Patient adopted  PMH significant for DM, HTN, HLD, narcolepsy, ITP s/p splenectomy, sarcoid currently dormant, and OSA on CPAP, lymphomatoid papulosis. Seen by Oncology [facility] this month for latter. Recommended q3-6 mo follow up for lymphoma evaluation.  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 Lives with spouse. Two adult kids, one is Environmental education officer one also works for [employer].  Review of Systems  Musculoskeletal: Positive for back pain and neck pain.   Remainder 12 point review negative    Objective:   Physical Exam  Cardiovascular: Normal rate, regular rhythm and normal heart sounds.  Pulmonary/Chest: Effort normal and breath sounds normal.  Lymphadenopathy:    She has no axillary adenopathy.  Skin:  Fitzpatrick 5    +shoulder grooving +kyphosis Grade 3 ptosis bilateral No masses Left > right volume SN to nipple R 39 L 42 cm BW R 27 L 27 cm Nipple to IMF R 13 L 13 cm     Assessment:     Macromastia Chronic neck and back pain Morbid obesity    Plan:     Chronic neck and back pain in setting of macromastia that has failed conservative measures and causes functional impairment with limited walking and  standing. Breast reduction surgery likely to result in significant improvement in this impairment.   Reviewed reduction with anchor type scars, drains, post operative visits and limitations, recovery. Diminished sensation nipple and breast skin, risk of nipple loss, wound healing problems, asymmetry, incidental carcinoma, changes with wt gain/loss, aging, unacceptable cosmetic appearance reviewed. Given current BMI will need to have surgery in hospital OR. Reviewed risk of need for free nipple graft and this decision may be made intraoperatively if any signs NAC compromise. In this setting NAC would be asensate not stimulate permanently. Plan overnight stay, will bring her CPAP.   Additional risks including but not limited to bleeding, hematoma, seroma, blood clots in legs or lungs, damage to adjacent structures, need for additional surgery, cardiopulmonary complications reviewed.  Discussed risk COVID infectionthrough this elective surgery. Patient will receive COVID testing prior to surgery. Discussed even if patient receivesa negative test result, the tests in some cases may fail to detect the virus or patient maycontract COVID after the test.COVID 19 infectionbefore/during/aftersurgery may result in lead to a higher chance of complication and death.Plans to receive vaccine following surgery.   Rx for Percocet given. Drain teaching done.  Anticipate 1445 g reduction from each breast.

## 2020-04-17 NOTE — Progress Notes (Signed)
[place], [place]. [place]. [place] [address] Phone: [phone] Fax: [phone]  [place], [place] - [address] [address] Phone: [phone] Fax: [phone]  [id number] [address], [place] [address] Phone: [phone] Fax: [phone]      Your procedure is scheduled on Tuesday, Apr 24, 2020.  Report to [facility] Main Entrance "A" at 5:30 A.M., and check in at the Admitting office.  Call this number if you have problems the morning of surgery:  [phone]  Call [phone] if you have any questions prior to your surgery date Monday-Friday 8am-4pm    Remember:  Do not eat after midnight the night before your surgery  You may drink clear liquids until 4:30 AM the morning of your surgery.   Clear liquids allowed are: Water, Non-Citrus Juices (without pulp), Carbonated Beverages, Clear Tea, Black Coffee Only, and Gatorade    Take these medicines the morning of surgery with A SIP OF WATER:  citalopram (CELEXA)  Fluticasone-Umeclidin-Vilant  gabapentin (NEURONTIN) loratadine (CLARITIN)  omeprazole (PRILOSEC) predniSONE (DELTASONE)      If needed: albuterol (PROVENTIL HFA;VENTOLIN HFA) 108 albuterol (PROVENTIL)  allopurinol (ZYLOPRIM) colchicine  tiZANidine (ZANAFLEX)  Please bring all inhalers with you the day of surgery.   As of today, STOP taking any Aspirin (unless otherwise instructed by your surgeon) and Aspirin containing products, Aleve, Naproxen, Ibuprofen, Motrin, Advil, Goody's, BC's, all herbal medications, fish oil, and all vitamins.                      Do not wear jewelry, make up, or nail polish            Do not wear lotions, powders, perfumes, or deodorant.            Do not shave 48 hours prior to surgery.            Do not bring valuables to the  hospital.            [facility] is not responsible for any belongings or valuables.  Do NOT Smoke (Tobacco/Vapping) or drink Alcohol 24 hours prior to your procedure If you use a CPAP at night, you may bring all equipment for your overnight stay.   Contacts, glasses, dentures or bridgework may not be worn into surgery.      For patients admitted to the hospital, discharge time will be determined by your treatment team.   Patients discharged the day of surgery will not be allowed to drive home, and someone needs to stay with them for 24 hours.    Special instructions:   [place]- Preparing For Surgery  Before surgery, you can play an important role. Because skin is not sterile, your skin needs to be as free of germs as possible. You can reduce the number of germs on your skin by washing with CHG (chlorahexidine gluconate) Soap before surgery.  CHG is an antiseptic cleaner which kills germs and bonds with the skin to continue killing germs even after washing.    Oral Hygiene is also important to reduce your risk of infection.  Remember - BRUSH YOUR TEETH THE MORNING OF SURGERY WITH YOUR REGULAR TOOTHPASTE  Please do not use if you have an allergy to CHG or antibacterial soaps. If your skin becomes reddened/irritated stop using the CHG.  Do not  shave (including legs and underarms) for at least 48 hours prior to first CHG shower. It is OK to shave your face.  Please follow these instructions carefully.   1. Shower the NIGHT BEFORE SURGERY and the MORNING OF SURGERY with CHG Soap.   2. If you chose to wash your hair, wash your hair first as usual with your normal shampoo.  3. After you shampoo, rinse your hair and body thoroughly to remove the shampoo.  4. Use CHG as you would any other liquid soap. You can apply CHG directly to the skin and wash gently with a scrungie or a clean washcloth.   5. Apply the CHG Soap to your body ONLY FROM THE NECK DOWN.  Do not use on open wounds or  open sores. Avoid contact with your eyes, ears, mouth and genitals (private parts). Wash Face and genitals (private parts)  with your normal soap.   6. Wash thoroughly, paying special attention to the area where your surgery will be performed.  7. Thoroughly rinse your body with warm water from the neck down.  8. DO NOT shower/wash with your normal soap after using and rinsing off the CHG Soap.  9. Pat yourself dry with a CLEAN TOWEL.  10. Wear CLEAN PAJAMAS to bed the night before surgery, wear comfortable clothes the morning of surgery  11. Place CLEAN SHEETS on your bed the night of your first shower and DO NOT SLEEP WITH PETS.   Day of Surgery:   Do not apply any deodorants/lotions.  Please wear clean clothes to the hospital/surgery center.   Remember to brush your teeth WITH YOUR REGULAR TOOTHPASTE.   Please read over the following fact sheets that you were given.

## 2020-04-18 ENCOUNTER — Encounter (HOSPITAL_COMMUNITY): Payer: Self-pay

## 2020-04-18 ENCOUNTER — Encounter (HOSPITAL_COMMUNITY)
Admission: RE | Admit: 2020-04-18 | Discharge: 2020-04-18 | Disposition: A | Discharge status: Home / Self Care | Payer: 59 | Source: Ambulatory Visit | Attending: Plastic Surgery | Admitting: Plastic Surgery

## 2020-04-18 ENCOUNTER — Other Ambulatory Visit: Payer: Self-pay

## 2020-04-18 DIAGNOSIS — I10 Essential (primary) hypertension: Secondary | ICD-10-CM | POA: Diagnosis not present

## 2020-04-18 DIAGNOSIS — Z01818 Encounter for other preprocedural examination: Secondary | ICD-10-CM | POA: Insufficient documentation

## 2020-04-18 HISTORY — DX: Sleep apnea, unspecified: G47.30

## 2020-04-18 HISTORY — DX: Prediabetes: R73.03

## 2020-04-18 HISTORY — DX: Gastro-esophageal reflux disease without esophagitis: K21.9

## 2020-04-18 HISTORY — DX: Anxiety disorder, unspecified: F41.9

## 2020-04-18 HISTORY — DX: Pneumonia, unspecified organism: J18.9

## 2020-04-18 LAB — CBC WITH DIFFERENTIAL/PLATELET
Abs Immature Granulocytes: 0.02 10*3/uL (ref 0.00–0.07)
Basophils Absolute: 0.1 10*3/uL (ref 0.0–0.1)
Basophils Relative: 1 %
Eosinophils Absolute: 0.5 10*3/uL (ref 0.0–0.5)
Eosinophils Relative: 5 %
HCT: 41.8 % (ref 36.0–46.0)
Hemoglobin: 13.6 g/dL (ref 12.0–15.0)
Immature Granulocytes: 0 %
Lymphocytes Relative: 46 %
Lymphs Abs: 4.5 10*3/uL — ABNORMAL HIGH (ref 0.7–4.0)
MCH: 30.2 pg (ref 26.0–34.0)
MCHC: 32.5 g/dL (ref 30.0–36.0)
MCV: 92.7 fL (ref 80.0–100.0)
Monocytes Absolute: 1.6 10*3/uL — ABNORMAL HIGH (ref 0.1–1.0)
Monocytes Relative: 17 %
Neutro Abs: 3 10*3/uL (ref 1.7–7.7)
Neutrophils Relative %: 31 %
Platelets: 327 10*3/uL (ref 150–400)
RBC: 4.51 MIL/uL (ref 3.87–5.11)
RDW: 15.5 % (ref 11.5–15.5)
WBC: 9.6 10*3/uL (ref 4.0–10.5)
nRBC: 0 % (ref 0.0–0.2)

## 2020-04-18 LAB — BASIC METABOLIC PANEL
Anion gap: 8 (ref 5–15)
BUN: 8 mg/dL (ref 6–20)
CO2: 25 mmol/L (ref 22–32)
Calcium: 9 mg/dL (ref 8.9–10.3)
Chloride: 106 mmol/L (ref 98–111)
Creatinine, Ser: 0.75 mg/dL (ref 0.44–1.00)
GFR calc Af Amer: >60 mL/min (ref >60)
GFR calc non Af Amer: >60 mL/min (ref >60)
Glucose, Bld: 80 mg/dL (ref 70–99)
Potassium: 3.8 mmol/L (ref 3.5–5.1)
Sodium: 139 mmol/L (ref 135–145)

## 2020-04-18 LAB — HEMOGLOBIN A1C
Hgb A1c MFr Bld: 5.9 % — ABNORMAL HIGH (ref 4.8–5.6)
Mean Plasma Glucose: 122.63 mg/dL

## 2020-04-18 NOTE — Progress Notes (Signed)
PCP - Dr. Steele Sizer @ [facility] Cardiologist - Denies  PPM/ICD - Denies  Chest x-ray - N/A EKG - 04/18/20 Stress Test - Denies ECHO - Denies Cardiac Cath - Denies  Sleep Study - Yes BIPAP - Yes  Pt may be prediabetic.   Blood Thinner Instructions: N/A Aspirin Instructions: N/A  ERAS Protcol - Yes PRE-SURGERY Ensure or G2- No  COVID TEST- 04/18/20   Coronavirus Screening  Have you experienced the following symptoms:  Cough yes/no: No Fever (>100.20F)  yes/no: No Runny nose yes/no: No Sore throat yes/no: No Difficulty breathing/shortness of breath  yes/no: No  Have you or a family member traveled in the last 14 days and where? yes/no: No   If the patient indicates "YES" to the above questions, their PAT will be rescheduled to limit the exposure to others and, the surgeon will be notified. THE PATIENT WILL NEED TO BE ASYMPTOMATIC FOR 14 DAYS.   If the patient is not experiencing any of these symptoms, the PAT nurse will instruct them to NOT bring anyone with them to their appointment since they may have these symptoms or traveled as well.   Please remind your patients and families that hospital visitation restrictions are in effect and the importance of the restrictions.     Anesthesia review: Yes, abnormal EKG  Patient denies shortness of breath, fever, cough and chest pain at PAT appointment   All instructions explained to the patient, with a verbal understanding of the material. Patient agrees to go over the instructions while at home for a better understanding. Patient also instructed to self quarantine after being tested for COVID-19. The opportunity to ask questions was provided.

## 2020-04-19 NOTE — Progress Notes (Signed)
Anesthesia Chart Review:  Pt was referred to cardiology in June 2020 for preop eval prior to cervical spine surgery due to history of sarcoidosis. Per note 06/06/19, "[name] is a 54 y.o. 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 Sherlyn Lick. This morning [name] presents to the EP clinic today to establish care for as well as obtain a pre-surgical cardiac risk assessment... Revised Cardiac Risk Index (RCRI) = 0 (zero). Based on the RCRI at this time we consider that pt is at low risk to undergo planned laminoplasty with cervical spinal decompression and fusion from an electrophysiology perspective. Have made the above recommendations which include pre-operative TTE which has been scheduled for 06/07/2019. If results are normal, no further workup would be recommended from an EP/cardiology perspective." TTE showed EF 65%, mild AS with mean gradient of 12 mmHg. She did undergo multilevel posterior cervical decompression and fusion [id number] without complication.   Pt follows with pulmonologist at [facility] Dr. Verdie Mosher for hx of sarcoidosis and moderate persistent asthma. She reports she has not had any exacerbations this year, says she has been doing well. She is compliant with her daily Trelegy Ellipta and has not required PRN albuterol at all this year.   Pt follows with hematology at [facility] for hx of Lymphomatoid papulosis. She is maintained on methotrexate for this.   Morbid obesity s/p sleeve gastrectomy2018.   OSA on CPAP.  Hx of ITP s/p splenectomy  Preop labs reviewed, unremarkable. DMII well controlled A1c 5.9.  EKG 04/18/20: Normal sinus rhythm. Rate 79.  Minimal voltage criteria  for LVH. Cannot rule out Anterior infarct , age undetermined. No significant change since 04/03/19.  CHEST - 2 VIEW 04/10/19: COMPARISON:  01/02/2013  FINDINGS: Upper lobe predominant subtle scarring and mild diffuse interstitial pulmonary opacity. There is no acute or focal appearing airspace opacity.  IMPRESSION: Upper lobe predominant subtle scarring and mild diffuse interstitial pulmonary opacity. There is no acute or focal appearing airspace opacity. CT may be used for evaluation of interstitial lung disease if desired.  TTE 06/07/19 (care everywhere): Conclusions Summary Mild concentric left ventricular hypertrophy. Normal left ventricular size and systolic function with no appreciable segmental abnormality. Ejection fraction is visually estimated at 65%. Diastolic function is indeterminate. Moderate mitral annular calcification. The anterior mitral valve leaflet is pliable and mobile. The posterior mitral valve leaflet is not well visualized. A mean pressure gradient of 5 mmHg was obtained. Velocities through the mitral valve mat be overestimated. Mitral valve area of 2.25 cm squared. Trace mitral regurgitation. Mild annular aortic valve sclerosis. Mild aortic stenosis. A mean pressure gradient of 12 mmHg was obtained. Aortic valve area of 1.76 cm squared. Dimensionless index: 0.49.  Spirometry 04/27/19 (copy on chart): FEV1-  2.32 L / 78% predicted,  FVC-    2.92 L / 90% predicted,  FEV1/FVC- 85%,   CT Chest 12/01/16 (care everywhere): INTERPRETATION: CHEST: Changes compatible with patient's known history of sarcoidosis are again identified. There are no new pulmonary infiltrates compared to prior CT 11/08/2016, 11/17, or 2/17. Calcified mediastinal unenlarged lymph nodes are again identified. Thoracic aorta appears atherosclerotic, but not aneurysmal. No sign of developing pleural effusion. No pneumothorax. No change in the heart size. No evidence of CHF. Prominent mitral  annular calcifications again identified. There is no new bony abnormality. Images of the included upper abdomen show a lobulated contour to the liver consistent with cirrhosis as seen on multiple prior studies.  IMPRESSION: Changes compatible with patient's known history of sarcoidosis again identified. No new pulmonary infiltrates.    Diana Musty [facility] Short Stay Center/Anesthesiology Phone [phone] 04/23/2020 9:39 AM

## 2020-04-20 ENCOUNTER — Other Ambulatory Visit (HOSPITAL_COMMUNITY)
Admission: RE | Admit: 2020-04-20 | Discharge: 2020-04-20 | Disposition: A | Discharge status: Home / Self Care | Payer: 59 | Source: Ambulatory Visit | Attending: Plastic Surgery | Admitting: Plastic Surgery

## 2020-04-20 DIAGNOSIS — Z20822 Contact with and (suspected) exposure to covid-19: Secondary | ICD-10-CM | POA: Insufficient documentation

## 2020-04-20 DIAGNOSIS — Z01812 Encounter for preprocedural laboratory examination: Secondary | ICD-10-CM | POA: Diagnosis not present

## 2020-04-20 LAB — SARS CORONAVIRUS 2 (TAT 6-24 HRS): SARS Coronavirus 2: NEGATIVE

## 2020-04-23 MED ORDER — DEXTROSE 5 % IV SOLN
3.0000 g | INTRAVENOUS | Status: AC
  Administered 2020-04-24: 3 g via INTRAVENOUS
  Filled 2020-04-23: qty 3
  Filled 2020-04-23: qty 3000

## 2020-04-23 NOTE — Anesthesia Preprocedure Evaluation (Addendum)
Anesthesia Evaluation  Patient identified by MRN, date of birth, ID band Patient awake    Reviewed: Allergy & Precautions, NPO status , Patient's Chart, lab work & pertinent test results  History of Anesthesia Complications Negative for: history of anesthetic complications  Airway Mallampati: II  TM Distance: >3 FB Neck ROM: Full    Dental  (+) Missing,    Pulmonary asthma , former smoker,  sarcoidosis   Pulmonary exam normal        Cardiovascular hypertension, Normal cardiovascular exam     Neuro/Psych Anxiety Depression negative neurological ROS     GI/Hepatic Neg liver ROS, GERD  Medicated and Controlled,  Endo/Other  Morbid obesity  Renal/GU negative Renal ROS  negative genitourinary   Musculoskeletal negative musculoskeletal ROS (+)   Abdominal   Peds  Hematology negative hematology ROS (+)   Anesthesia Other Findings Day of surgery medications reviewed with patient.  Reproductive/Obstetrics negative OB ROS                           Anesthesia Physical Anesthesia Plan  ASA: III  Anesthesia Plan: General   Post-op Pain Management:    Induction: Intravenous  PONV Risk Score and Plan: 4 or greater and Treatment may vary due to age or medical condition, Ondansetron, Dexamethasone and Midazolam  Airway Management Planned: Oral ETT  Additional Equipment: None  Intra-op Plan:   Post-operative Plan: Extubation in OR  Informed Consent: I have reviewed the patients History and Physical, chart, labs and discussed the procedure including the risks, benefits and alternatives for the proposed anesthesia with the patient or authorized representative who has indicated his/her understanding and acceptance.     Dental advisory given  Plan Discussed with: CRNA  Anesthesia Plan Comments: (PAT note by Karoline Caldwell, PA-C: Pt was referred to cardiology in June 2020 for preop eval prior  to cervical spine surgery due to history of sarcoidosis. Per note 06/06/19, "Tamiah Robello is a 54 y.o. 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 Sherlyn Lick. This morning Ms. Vigne presents to the EP clinic today to establish care for as well as obtain a pre-surgical cardiac risk assessment... Revised Cardiac Risk Index (RCRI) = 0 (zero). Based on the RCRI at this time we consider that pt is at low risk to undergo planned laminoplasty with cervical spinal decompression and fusion from an electrophysiology perspective. Have made the above recommendations which include pre-operative TTE which has been scheduled for 06/07/2019. If results are normal, no further workup would be recommended from an EP/cardiology perspective." TTE showed EF 65%, mild AS with mean gradient of 12 mmHg. She did undergo multilevel posterior cervical decompression and fusion [id number] without complication.   Pt follows with pulmonologist at [facility] Dr. Verdie Mosher for hx of sarcoidosis and moderate persistent asthma. She reports she has not had any exacerbations this year, says she has been doing well. She is compliant with her daily Trelegy Ellipta and has not required PRN albuterol at all this year.   Pt follows with hematology at [facility] for hx of Lymphomatoid papulosis. She is maintained on methotrexate for this.   Morbid obesity s/p sleeve gastrectomy2018.   OSA on CPAP.  Hx of ITP s/p  splenectomy  Preop labs reviewed, unremarkable. DMII well controlled A1c 5.9.  EKG 04/18/20: Normal sinus rhythm. Rate 79.  Minimal voltage criteria for LVH. Cannot rule out Anterior infarct , age undetermined. No significant change since  04/03/19.  CHEST - 2 VIEW 04/10/19: COMPARISON:  01/02/2013  FINDINGS: Upper lobe predominant subtle scarring and mild diffuse interstitial pulmonary opacity. There is no acute or focal appearing airspace opacity.  IMPRESSION: Upper lobe predominant subtle scarring and mild diffuse interstitial pulmonary opacity. There is no acute or focal appearing airspace opacity. CT may be used for evaluation of interstitial lung disease if desired.  TTE 06/07/19 (care everywhere): Conclusions Summary Mild concentric left ventricular hypertrophy. Normal left ventricular size and systolic function with no appreciable segmental abnormality. Ejection fraction is visually estimated at 65%. Diastolic function is indeterminate. Moderate mitral annular calcification. The anterior mitral valve leaflet is pliable and mobile. The posterior mitral valve leaflet is not well visualized. A mean pressure gradient of 5 mmHg was obtained. Velocities through the mitral valve mat be overestimated. Mitral valve area of 2.25 cm squared. Trace mitral regurgitation. Mild annular aortic valve sclerosis. Mild aortic stenosis. A mean pressure gradient of 12 mmHg was obtained. Aortic valve area of 1.76 cm squared. Dimensionless index: 0.49.  Spirometry 04/27/19 (copy on chart): FEV1-  2.32 L / 78% predicted,  FVC-    2.92 L / 90% predicted,  FEV1/FVC- 85%,   CT Chest 12/01/16 (care everywhere): INTERPRETATION: CHEST: Changes compatible with patient's known history of sarcoidosis are again identified. There are no new pulmonary infiltrates compared to prior CT 11/08/2016, 11/17, or 2/17. Calcified mediastinal unenlarged lymph nodes are again identified. Thoracic aorta appears atherosclerotic, but not aneurysmal. No sign of developing pleural effusion. No pneumothorax. No change in the heart size. No evidence of CHF. Prominent mitral annular calcifications again identified. There is no new bony abnormality. Images of the  included upper abdomen show a lobulated contour to the liver consistent with cirrhosis as seen on multiple prior studies.  IMPRESSION: Changes compatible with patient's known history of sarcoidosis again identified. No new pulmonary infiltrates.  )      Anesthesia Quick Evaluation

## 2020-04-24 ENCOUNTER — Ambulatory Visit (HOSPITAL_COMMUNITY): Payer: 59 | Admitting: Certified Registered"

## 2020-04-24 ENCOUNTER — Encounter (HOSPITAL_COMMUNITY): Payer: Self-pay | Admitting: Plastic Surgery

## 2020-04-24 ENCOUNTER — Ambulatory Visit (HOSPITAL_COMMUNITY): Payer: 59 | Admitting: Physician Assistant

## 2020-04-24 ENCOUNTER — Observation Stay (HOSPITAL_COMMUNITY)
Admission: RE | Admit: 2020-04-24 | Discharge: 2020-04-25 | Disposition: A | Discharge status: Home / Self Care | Payer: 59 | Attending: Plastic Surgery | Admitting: Plastic Surgery

## 2020-04-24 ENCOUNTER — Encounter (HOSPITAL_COMMUNITY)
Admission: RE | Disposition: A | Discharge status: Home / Self Care | Payer: Self-pay | Source: Home / Self Care | Attending: Plastic Surgery

## 2020-04-24 ENCOUNTER — Ambulatory Visit: Admit: 2020-04-24 | Payer: Medicare Other | Admitting: Plastic Surgery

## 2020-04-24 ENCOUNTER — Other Ambulatory Visit: Payer: Self-pay

## 2020-04-24 DIAGNOSIS — G4733 Obstructive sleep apnea (adult) (pediatric): Secondary | ICD-10-CM | POA: Diagnosis not present

## 2020-04-24 DIAGNOSIS — I10 Essential (primary) hypertension: Secondary | ICD-10-CM | POA: Insufficient documentation

## 2020-04-24 DIAGNOSIS — D869 Sarcoidosis, unspecified: Secondary | ICD-10-CM | POA: Diagnosis not present

## 2020-04-24 DIAGNOSIS — Z6841 Body Mass Index (BMI) 40.0 and over, adult: Secondary | ICD-10-CM | POA: Diagnosis not present

## 2020-04-24 DIAGNOSIS — D693 Immune thrombocytopenic purpura: Secondary | ICD-10-CM | POA: Insufficient documentation

## 2020-04-24 DIAGNOSIS — M542 Cervicalgia: Secondary | ICD-10-CM | POA: Insufficient documentation

## 2020-04-24 DIAGNOSIS — G8929 Other chronic pain: Secondary | ICD-10-CM | POA: Diagnosis not present

## 2020-04-24 DIAGNOSIS — N62 Hypertrophy of breast: Principal | ICD-10-CM | POA: Insufficient documentation

## 2020-04-24 DIAGNOSIS — N6082 Other benign mammary dysplasias of left breast: Secondary | ICD-10-CM | POA: Insufficient documentation

## 2020-04-24 DIAGNOSIS — C866 Primary cutaneous CD30-positive T-cell proliferations: Secondary | ICD-10-CM | POA: Diagnosis not present

## 2020-04-24 DIAGNOSIS — Z9884 Bariatric surgery status: Secondary | ICD-10-CM | POA: Diagnosis not present

## 2020-04-24 DIAGNOSIS — I340 Nonrheumatic mitral (valve) insufficiency: Secondary | ICD-10-CM | POA: Diagnosis not present

## 2020-04-24 DIAGNOSIS — Z79899 Other long term (current) drug therapy: Secondary | ICD-10-CM | POA: Insufficient documentation

## 2020-04-24 DIAGNOSIS — M549 Dorsalgia, unspecified: Secondary | ICD-10-CM | POA: Insufficient documentation

## 2020-04-24 DIAGNOSIS — E119 Type 2 diabetes mellitus without complications: Secondary | ICD-10-CM | POA: Insufficient documentation

## 2020-04-24 DIAGNOSIS — G47419 Narcolepsy without cataplexy: Secondary | ICD-10-CM | POA: Diagnosis not present

## 2020-04-24 DIAGNOSIS — E785 Hyperlipidemia, unspecified: Secondary | ICD-10-CM | POA: Insufficient documentation

## 2020-04-24 HISTORY — PX: BREAST REDUCTION SURGERY: SHX8

## 2020-04-24 LAB — POCT PREGNANCY, URINE: Preg Test, Ur: NEGATIVE

## 2020-04-24 SURGERY — MAMMOPLASTY, REDUCTION
Anesthesia: General | Site: Breast | Laterality: Bilateral

## 2020-04-24 MED ORDER — FLUTICASONE FUROATE-VILANTEROL 100-25 MCG/INH IN AEPB
1.0000 | INHALATION_SPRAY | Freq: Every day | RESPIRATORY_TRACT | Status: DC
  Filled 2020-04-24: qty 28

## 2020-04-24 MED ORDER — UMECLIDINIUM BROMIDE 62.5 MCG/INH IN AEPB
1.0000 | INHALATION_SPRAY | Freq: Every day | RESPIRATORY_TRACT | Status: DC
  Filled 2020-04-24: qty 7

## 2020-04-24 MED ORDER — MIDAZOLAM HCL 2 MG/2ML IJ SOLN
INTRAMUSCULAR | Status: AC
  Filled 2020-04-24: qty 2

## 2020-04-24 MED ORDER — BUPIVACAINE HCL (PF) 0.25 % IJ SOLN
INTRAMUSCULAR | Status: AC
  Filled 2020-04-24: qty 30

## 2020-04-24 MED ORDER — 0.9 % SODIUM CHLORIDE (POUR BTL) OPTIME
TOPICAL | Status: DC | PRN
  Administered 2020-04-24: 1000 mL

## 2020-04-24 MED ORDER — PROMETHAZINE HCL 25 MG/ML IJ SOLN: 6.2500 mg | INTRAMUSCULAR | Status: DC | PRN

## 2020-04-24 MED ORDER — FENTANYL CITRATE (PF) 100 MCG/2ML IJ SOLN
INTRAMUSCULAR | Status: AC
  Filled 2020-04-24: qty 2

## 2020-04-24 MED ORDER — CITALOPRAM HYDROBROMIDE 20 MG PO TABS
20.0000 mg | ORAL_TABLET | Freq: Every day | ORAL | Status: DC
  Administered 2020-04-24 – 2020-04-25 (×2): 20 mg via ORAL
  Filled 2020-04-24 (×2): qty 1

## 2020-04-24 MED ORDER — KETOROLAC TROMETHAMINE 30 MG/ML IJ SOLN
30.0000 mg | Freq: Three times a day (TID) | INTRAMUSCULAR | Status: AC
  Administered 2020-04-24 – 2020-04-25 (×3): 30 mg via INTRAVENOUS
  Filled 2020-04-24 (×3): qty 1

## 2020-04-24 MED ORDER — CHLORHEXIDINE GLUCONATE 0.12 % MT SOLN
OROMUCOSAL | Status: AC
  Administered 2020-04-24: 15 mL via OROMUCOSAL
  Filled 2020-04-24: qty 15

## 2020-04-24 MED ORDER — MIDAZOLAM HCL 5 MG/5ML IJ SOLN
INTRAMUSCULAR | Status: DC | PRN
  Administered 2020-04-24: 2 mg via INTRAVENOUS

## 2020-04-24 MED ORDER — ENOXAPARIN SODIUM 40 MG/0.4ML ~~LOC~~ SOLN: 40.0000 mg | SUBCUTANEOUS | Status: DC

## 2020-04-24 MED ORDER — OXYCODONE HCL 5 MG PO TABS: 5.0000 mg | ORAL_TABLET | Freq: Once | ORAL | Status: DC | PRN

## 2020-04-24 MED ORDER — ALBUTEROL SULFATE (2.5 MG/3ML) 0.083% IN NEBU
2.5000 mg | INHALATION_SOLUTION | RESPIRATORY_TRACT | Status: DC | PRN
  Administered 2020-04-24: 2.5 mg via RESPIRATORY_TRACT
  Filled 2020-04-24: qty 3

## 2020-04-24 MED ORDER — HYDROMORPHONE HCL 1 MG/ML IJ SOLN
0.5000 mg | INTRAMUSCULAR | Status: DC | PRN
  Administered 2020-04-25: 0.5 mg via INTRAVENOUS
  Filled 2020-04-24: qty 1

## 2020-04-24 MED ORDER — ALBUTEROL SULFATE HFA 108 (90 BASE) MCG/ACT IN AERS: 2.0000 | INHALATION_SPRAY | RESPIRATORY_TRACT | Status: DC | PRN

## 2020-04-24 MED ORDER — BUPIVACAINE LIPOSOME 1.3 % IJ SUSP
20.0000 mL | Freq: Once | INTRAMUSCULAR | Status: DC
  Filled 2020-04-24: qty 20

## 2020-04-24 MED ORDER — FENTANYL CITRATE (PF) 250 MCG/5ML IJ SOLN
INTRAMUSCULAR | Status: AC
  Filled 2020-04-24: qty 5

## 2020-04-24 MED ORDER — FENTANYL CITRATE (PF) 100 MCG/2ML IJ SOLN
25.0000 ug | INTRAMUSCULAR | Status: DC | PRN
  Administered 2020-04-24: 25 ug via INTRAVENOUS
  Administered 2020-04-24 (×2): 50 ug via INTRAVENOUS
  Administered 2020-04-24: 25 ug via INTRAVENOUS

## 2020-04-24 MED ORDER — ALLOPURINOL 300 MG PO TABS: 300.0000 mg | ORAL_TABLET | Freq: Every day | ORAL | Status: DC | PRN

## 2020-04-24 MED ORDER — ACETAMINOPHEN 500 MG PO TABS: 1000.0000 mg | ORAL_TABLET | Freq: Once | ORAL | Status: AC

## 2020-04-24 MED ORDER — HEPARIN SODIUM (PORCINE) 5000 UNIT/ML IJ SOLN: 5000.0000 [IU] | Freq: Once | INTRAMUSCULAR | Status: AC

## 2020-04-24 MED ORDER — ONDANSETRON HCL 4 MG/2ML IJ SOLN
INTRAMUSCULAR | Status: AC
  Filled 2020-04-24: qty 2

## 2020-04-24 MED ORDER — PROPOFOL 10 MG/ML IV BOLUS
INTRAVENOUS | Status: AC
  Filled 2020-04-24: qty 20

## 2020-04-24 MED ORDER — LACTATED RINGERS IV SOLN: INTRAVENOUS | Status: DC | PRN

## 2020-04-24 MED ORDER — ROCURONIUM BROMIDE 10 MG/ML (PF) SYRINGE
PREFILLED_SYRINGE | INTRAVENOUS | Status: AC
  Filled 2020-04-24: qty 10

## 2020-04-24 MED ORDER — ORAL CARE MOUTH RINSE: 15.0000 mL | Freq: Once | OROMUCOSAL | Status: AC

## 2020-04-24 MED ORDER — ESMOLOL HCL 100 MG/10ML IV SOLN
INTRAVENOUS | Status: DC | PRN
Start: 2020-04-24 — End: 2020-04-24
  Administered 2020-04-24: 20 mg via INTRAVENOUS
  Administered 2020-04-24: 30 mg via INTRAVENOUS

## 2020-04-24 MED ORDER — ONDANSETRON HCL 4 MG/2ML IJ SOLN: 4.0000 mg | Freq: Four times a day (QID) | INTRAMUSCULAR | Status: DC | PRN

## 2020-04-24 MED ORDER — ENOXAPARIN SODIUM 80 MG/0.8ML ~~LOC~~ SOLN
0.5000 mg/kg | SUBCUTANEOUS | Status: DC
  Administered 2020-04-25: 65 mg via SUBCUTANEOUS
  Filled 2020-04-24: qty 0.8

## 2020-04-24 MED ORDER — COLCHICINE 0.6 MG PO TABS: 0.6000 mg | ORAL_TABLET | Freq: Every day | ORAL | Status: DC | PRN

## 2020-04-24 MED ORDER — FENTANYL CITRATE (PF) 100 MCG/2ML IJ SOLN
INTRAMUSCULAR | Status: DC | PRN
  Administered 2020-04-24: 25 ug via INTRAVENOUS
  Administered 2020-04-24: 50 ug via INTRAVENOUS
  Administered 2020-04-24: 25 ug via INTRAVENOUS
  Administered 2020-04-24 (×2): 50 ug via INTRAVENOUS
  Administered 2020-04-24 (×3): 25 ug via INTRAVENOUS
  Administered 2020-04-24: 50 ug via INTRAVENOUS
  Administered 2020-04-24: 25 ug via INTRAVENOUS
  Administered 2020-04-24: 50 ug via INTRAVENOUS
  Administered 2020-04-24 (×2): 25 ug via INTRAVENOUS

## 2020-04-24 MED ORDER — GABAPENTIN 300 MG PO CAPS
300.0000 mg | ORAL_CAPSULE | Freq: Three times a day (TID) | ORAL | Status: DC
  Administered 2020-04-24 – 2020-04-25 (×2): 300 mg via ORAL
  Filled 2020-04-24 (×2): qty 1

## 2020-04-24 MED ORDER — OXYCODONE HCL 5 MG/5ML PO SOLN: 5.0000 mg | Freq: Once | ORAL | Status: DC | PRN

## 2020-04-24 MED ORDER — BUPIVACAINE HCL (PF) 0.25 % IJ SOLN
INTRAMUSCULAR | Status: DC | PRN
  Administered 2020-04-24: 30 mL

## 2020-04-24 MED ORDER — PANTOPRAZOLE SODIUM 40 MG PO TBEC
80.0000 mg | DELAYED_RELEASE_TABLET | Freq: Every day | ORAL | Status: DC
  Administered 2020-04-25: 80 mg via ORAL
  Filled 2020-04-24: qty 2

## 2020-04-24 MED ORDER — ROCURONIUM BROMIDE 100 MG/10ML IV SOLN
INTRAVENOUS | Status: DC | PRN
  Administered 2020-04-24: 20 mg via INTRAVENOUS
  Administered 2020-04-24: 80 mg via INTRAVENOUS
  Administered 2020-04-24: 20 mg via INTRAVENOUS
  Administered 2020-04-24: 10 mg via INTRAVENOUS

## 2020-04-24 MED ORDER — ALBUTEROL SULFATE (2.5 MG/3ML) 0.083% IN NEBU: 2.5000 mg | INHALATION_SOLUTION | Freq: Four times a day (QID) | RESPIRATORY_TRACT | Status: DC | PRN

## 2020-04-24 MED ORDER — ONDANSETRON 4 MG PO TBDP: 4.0000 mg | ORAL_TABLET | Freq: Four times a day (QID) | ORAL | Status: DC | PRN

## 2020-04-24 MED ORDER — FLUTICASONE FUROATE-VILANTEROL 100-25 MCG/INH IN AEPB
1.0000 | INHALATION_SPRAY | Freq: Every day | RESPIRATORY_TRACT | Status: DC
  Administered 2020-04-25: 1 via RESPIRATORY_TRACT
  Filled 2020-04-24: qty 28

## 2020-04-24 MED ORDER — PROPOFOL 10 MG/ML IV BOLUS
INTRAVENOUS | Status: DC | PRN
  Administered 2020-04-24: 20 mg via INTRAVENOUS
  Administered 2020-04-24: 200 mg via INTRAVENOUS

## 2020-04-24 MED ORDER — ACETAMINOPHEN 500 MG PO TABS
ORAL_TABLET | ORAL | Status: AC
  Administered 2020-04-24: 1000 mg via ORAL
  Filled 2020-04-24: qty 2

## 2020-04-24 MED ORDER — ONDANSETRON HCL 4 MG/2ML IJ SOLN
INTRAMUSCULAR | Status: DC | PRN
  Administered 2020-04-24 (×2): 4 mg via INTRAVENOUS

## 2020-04-24 MED ORDER — HEPARIN SODIUM (PORCINE) 5000 UNIT/ML IJ SOLN
INTRAMUSCULAR | Status: AC
  Administered 2020-04-24: 5000 [IU] via SUBCUTANEOUS
  Filled 2020-04-24: qty 1

## 2020-04-24 MED ORDER — FLUTICASONE-UMECLIDIN-VILANT 100-62.5-25 MCG/INH IN AEPB: 1.0000 | INHALATION_SPRAY | Freq: Every day | RESPIRATORY_TRACT | Status: DC

## 2020-04-24 MED ORDER — CHLORHEXIDINE GLUCONATE 0.12 % MT SOLN: 15.0000 mL | Freq: Once | OROMUCOSAL | Status: AC

## 2020-04-24 MED ORDER — DEXAMETHASONE SODIUM PHOSPHATE 10 MG/ML IJ SOLN
INTRAMUSCULAR | Status: DC | PRN
  Administered 2020-04-24: 5 mg via INTRAVENOUS

## 2020-04-24 MED ORDER — TIZANIDINE HCL 2 MG PO TABS: 4.0000 mg | ORAL_TABLET | Freq: Three times a day (TID) | ORAL | Status: DC | PRN

## 2020-04-24 MED ORDER — SUGAMMADEX SODIUM 200 MG/2ML IV SOLN
INTRAVENOUS | Status: DC | PRN
  Administered 2020-04-24: 300 mg via INTRAVENOUS

## 2020-04-24 MED ORDER — UMECLIDINIUM BROMIDE 62.5 MCG/INH IN AEPB
1.0000 | INHALATION_SPRAY | Freq: Every day | RESPIRATORY_TRACT | Status: DC
  Administered 2020-04-25: 1 via RESPIRATORY_TRACT
  Filled 2020-04-24: qty 7

## 2020-04-24 MED ORDER — CHLORHEXIDINE GLUCONATE CLOTH 2 % EX PADS: 6.0000 | MEDICATED_PAD | Freq: Once | CUTANEOUS | Status: DC

## 2020-04-24 MED ORDER — LIDOCAINE 2% (20 MG/ML) 5 ML SYRINGE
INTRAMUSCULAR | Status: DC | PRN
  Administered 2020-04-24: 100 mg via INTRAVENOUS

## 2020-04-24 MED ORDER — DEXAMETHASONE SODIUM PHOSPHATE 10 MG/ML IJ SOLN
INTRAMUSCULAR | Status: AC
  Filled 2020-04-24: qty 1

## 2020-04-24 MED ORDER — LIDOCAINE 2% (20 MG/ML) 5 ML SYRINGE
INTRAMUSCULAR | Status: AC
  Filled 2020-04-24: qty 5

## 2020-04-24 MED ORDER — KCL IN DEXTROSE-NACL 20-5-0.45 MEQ/L-%-% IV SOLN
INTRAVENOUS | Status: DC
  Filled 2020-04-24 (×2): qty 1000

## 2020-04-24 MED ORDER — OXYCODONE-ACETAMINOPHEN 5-325 MG PO TABS
1.0000 | ORAL_TABLET | ORAL | Status: DC | PRN
  Administered 2020-04-25: 2 via ORAL
  Filled 2020-04-24: qty 2

## 2020-04-24 SURGICAL SUPPLY — 53 items
BINDER BREAST 3XL (GAUZE/BANDAGES/DRESSINGS) ×1 IMPLANT
BINDER BREAST LRG (GAUZE/BANDAGES/DRESSINGS) IMPLANT
BINDER BREAST MEDIUM (GAUZE/BANDAGES/DRESSINGS) IMPLANT
BINDER BREAST XLRG (GAUZE/BANDAGES/DRESSINGS) IMPLANT
BINDER BREAST XXLRG (GAUZE/BANDAGES/DRESSINGS) ×1 IMPLANT
BLADE SURG 10 STRL SS (BLADE) ×4 IMPLANT
BLADE SURG 15 STRL LF DISP TIS (BLADE) ×1 IMPLANT
BLADE SURG 15 STRL SS (BLADE) ×1
CANISTER SUCT 3000ML PPV (MISCELLANEOUS) ×2 IMPLANT
CHLORAPREP W/TINT 26 (MISCELLANEOUS) ×3 IMPLANT
COVER WAND RF STERILE (DRAPES) ×1 IMPLANT
DECANTER SPIKE VIAL GLASS SM (MISCELLANEOUS) ×2 IMPLANT
DERMABOND ADHESIVE PROPEN (GAUZE/BANDAGES/DRESSINGS) ×1
DERMABOND ADVANCED (GAUZE/BANDAGES/DRESSINGS) ×3
DERMABOND ADVANCED .7 DNX12 (GAUZE/BANDAGES/DRESSINGS) ×1 IMPLANT
DERMABOND ADVANCED .7 DNX6 (GAUZE/BANDAGES/DRESSINGS) IMPLANT
DRAIN CHANNEL 15F RND FF W/TCR (WOUND CARE) ×2 IMPLANT
DRAPE HALF SHEET 40X57 (DRAPES) ×4 IMPLANT
DRAPE ORTHO SPLIT 77X108 STRL (DRAPES) ×2
DRAPE SURG ORHT 6 SPLT 77X108 (DRAPES) ×2 IMPLANT
DRAPE UTILITY 15X26 TOWEL STRL (DRAPES) ×1 IMPLANT
DRAPE UTILITY XL STRL (DRAPES) ×1 IMPLANT
DRSG PAD ABDOMINAL 8X10 ST (GAUZE/BANDAGES/DRESSINGS) ×6 IMPLANT
ELECT BLADE 4.0 EZ CLEAN MEGAD (MISCELLANEOUS) ×2
ELECT COATED BLADE 2.86 ST (ELECTRODE) ×2 IMPLANT
ELECT REM PT RETURN 9FT ADLT (ELECTROSURGICAL) ×2
ELECTRODE BLDE 4.0 EZ CLN MEGD (MISCELLANEOUS) ×1 IMPLANT
ELECTRODE REM PT RTRN 9FT ADLT (ELECTROSURGICAL) ×1 IMPLANT
EVACUATOR SILICONE 100CC (DRAIN) ×2 IMPLANT
GLOVE BIO SURGEON STRL SZ 6 (GLOVE) ×4 IMPLANT
GLOVE BIOGEL PI IND STRL 6.5 (GLOVE) IMPLANT
GLOVE BIOGEL PI INDICATOR 6.5 (GLOVE) ×1
GLOVE ECLIPSE 6.5 STRL STRAW (GLOVE) ×1 IMPLANT
GOWN STRL REUS W/ TWL LRG LVL3 (GOWN DISPOSABLE) ×2 IMPLANT
GOWN STRL REUS W/TWL LRG LVL3 (GOWN DISPOSABLE) ×4
MARKER SKIN DUAL TIP RULER LAB (MISCELLANEOUS) ×2 IMPLANT
NDL HYPO 25GX1X1/2 BEV (NEEDLE) ×1 IMPLANT
NEEDLE HYPO 25GX1X1/2 BEV (NEEDLE) ×2 IMPLANT
NS IRRIG 1000ML POUR BTL (IV SOLUTION) ×2 IMPLANT
PACK GENERAL/GYN (CUSTOM PROCEDURE TRAY) ×2 IMPLANT
PENCIL BUTTON HOLSTER BLD 10FT (ELECTRODE) ×1 IMPLANT
SPONGE LAP 18X18 RF (DISPOSABLE) ×4 IMPLANT
STAPLER VISISTAT 35W (STAPLE) ×3 IMPLANT
SUT ETHILON 2 0 FS 18 (SUTURE) ×3 IMPLANT
SUT MNCRL AB 4-0 PS2 18 (SUTURE) ×8 IMPLANT
SUT VIC AB 3-0 PS1 18 (SUTURE) ×10
SUT VIC AB 3-0 PS1 18XBRD (SUTURE) IMPLANT
SUT VIC AB 3-0 PS2 18 (SUTURE)
SUT VIC AB 3-0 PS2 18XBRD (SUTURE) IMPLANT
SUT VICRYL 4-0 PS2 18IN ABS (SUTURE) ×7 IMPLANT
SYR CONTROL 10ML LL (SYRINGE) ×2 IMPLANT
TOWEL GREEN STERILE (TOWEL DISPOSABLE) ×2 IMPLANT
TRAY FOLEY MTR SLVR 14FR STAT (SET/KITS/TRAYS/PACK) ×1 IMPLANT

## 2020-04-24 NOTE — Progress Notes (Signed)
This pt arrived to 6N26. Pt alert and awake. Report received from [place], [place] in ED. See assessment and LDA. Will continue to monitor pt.

## 2020-04-24 NOTE — Plan of Care (Signed)
  Problem: Education: Goal: Knowledge of General Education information will improve Description: Including pain rating scale, medication(s)/side effects and non-pharmacologic comfort measures Outcome: Progressing   Problem: Nutrition: Goal: Adequate nutrition will be maintained Outcome: Progressing   Problem: Coping: Goal: Level of anxiety will decrease Outcome: Progressing   

## 2020-04-24 NOTE — Transfer of Care (Signed)
Immediate Anesthesia Transfer of Care Note  Patient: [name]  Procedure(s) Performed: BILATERAL BREAST REDUCTION (Bilateral Breast)  Patient Location: PACU  Anesthesia Type:General  Level of Consciousness: drowsy and patient cooperative  Airway & Oxygen Therapy: Patient Spontanous Breathing and Patient connected to face mask oxygen  Post-op Assessment: Report given to RN and Post -op Vital signs reviewed and stable  Post vital signs: Reviewed and stable  Last Vitals:  Vitals Value Taken Time  BP 152/93 04/24/20 1142  Temp 36.7 C 04/24/20 1140  Pulse 93 04/24/20 1143  Resp 25 04/24/20 1143  SpO2 99 % 04/24/20 1143  Vitals shown include unvalidated device data.  Last Pain:  Vitals:   04/24/20 0604  TempSrc: Oral  PainSc: 0-No pain      Patients Stated Pain Goal: 2 ([id number])  Complications: No apparent anesthesia complications

## 2020-04-24 NOTE — Anesthesia Procedure Notes (Signed)
Procedure Name: Intubation Date/Time: 04/24/2020 7:34 AM Performed by: Gwyndolyn Saxon, CRNA Pre-anesthesia Checklist: Patient identified, Emergency Drugs available, Suction available and Patient being monitored Patient Re-evaluated:Patient Re-evaluated prior to induction Oxygen Delivery Method: Circle system utilized Preoxygenation: Pre-oxygenation with 100% oxygen Induction Type: IV induction Ventilation: Mask ventilation without difficulty Laryngoscope Size: Glidescope and 3 Grade View: Grade I Tube type: Oral Tube size: 7.0 mm Number of attempts: 1 Airway Equipment and Method: Patient positioned with wedge pillow and Rigid stylet Placement Confirmation: ETT inserted through vocal cords under direct vision,  positive ETCO2 and breath sounds checked- equal and bilateral Secured at: 21 cm Tube secured with: Tape Dental Injury: Teeth and Oropharynx as per pre-operative assessment

## 2020-04-24 NOTE — Anesthesia Postprocedure Evaluation (Signed)
Anesthesia Post Note  Patient: [name]  Procedure(s) Performed: BILATERAL BREAST REDUCTION (Bilateral Breast)     Patient location during evaluation: PACU Anesthesia Type: General Level of consciousness: awake and alert and oriented Pain management: pain level controlled Vital Signs Assessment: post-procedure vital signs reviewed and stable Respiratory status: spontaneous breathing, nonlabored ventilation and respiratory function stable Cardiovascular status: blood pressure returned to baseline Postop Assessment: no apparent nausea or vomiting Anesthetic complications: no    Last Vitals:  Vitals:   04/24/20 1240 04/24/20 1255  BP: (!) 158/91 (!) 159/103  Pulse: 95 93  Resp: (!) 22 (!) 26  Temp:    SpO2: 96% 98%    Last Pain:  Vitals:   04/24/20 1240  TempSrc:   PainSc: [place]

## 2020-04-24 NOTE — Interval H&P Note (Signed)
History and Physical Interval Note:  04/24/2020 6:45 AM  Asenath Affinito  has presented today for surgery, with the diagnosis of macromastia, chronic neck and back pain.  The various methods of treatment have been discussed with the patient and family. After consideration of risks, benefits and other options for treatment, the patient has consented to  Procedure(s): BILATERAL BREAST REDUCTION (Bilateral) as a surgical intervention.  The patient's history has been reviewed, patient examined, no change in status, stable for surgery.  I have reviewed the patient's chart and labs.  Questions were answered to the patient's satisfaction.     Arnoldo Hooker Amarrion Pastorino

## 2020-04-24 NOTE — Op Note (Signed)
Operative Note   DATE OF OPERATION: 5.25.21  LOCATION: [facility]  SURGICAL DIVISION: Plastic Surgery  PREOPERATIVE DIAGNOSES:  1. Macromastia 2. Chronic neck and back pain  POSTOPERATIVE DIAGNOSES:  same  PROCEDURE:  Bilateral breast reduction  SURGEON: Irene Limbo MD MBA  ASSISTANT: Gentry Fitz RNFA  ANESTHESIA:  General.   EBL: [id number]  COMPLICATIONS: None immediate.   INDICATIONS FOR PROCEDURE:  The patient, [name], is a 54 y.o. female born on 1966/09/22, is here for treatment chronic neck and back pian in setting macromastia that has failed conservative management.   FINDINGS: Right reduction 1023 g Left 1228 g  DESCRIPTION OF PROCEDURE:  The patient was marked standing in the preoperative area to mark sternal notch, chest midline, anterior axillary lines, inframammary folds. The location of new nipple areolar complex was marked at level of on inframammary fold on anterior surface breast by palpation. This was marked symmetric over bilateral breasts. With aid of Wise pattern marker, location of new nipple areolar complex and vertical limbs (8cm) were markedby displacement of breasts along meridian. SQ heparin administered. The patient was taken to the operating room. SCDs were placed and IV antibiotics were given. Foley catheter placed. The patient's operative site was prepped and draped in a sterile fashion. A time out was performed and all information was confirmed to be correct.   Over left breast, superomedial pedicle marked and nipple areolar complexincisedwith 59mm diameter marker. Pedicle deepithlialized and developedto chest wall. Breast tissue resected over lower pole. Medial and lateral flaps developed.Additionalsuperior pole andlateral chest wall tissue excised.Breast tailor tacked closed.  I then directed attention to right breast where superomedial pedicle designed.NAC marked with 85mm diameter marker.The pedicle was  deepithelialized. Pedicle developed until tension free rotation was possible.Breast tissue resected over lower pole. Medial and lateral flaps developed.Additionalsuperior pole andlateral chest wall tissue excised. Breast tailor tacked closed, and patient brought to upright sitting position and assessed for symmetry. Patent returned to supine position. Breast cavities irrigated and hemostasis obtained.Local anesthetic infiltrated throughout each breast. 15 Fr JP placed in each breast and secured with 2-0 nylon. Closure completed bilateralwith 3-0 vicryl to approximate dermis along inframammary fold and vertical limb. NAC inset with 4-0 vicryl in dermis. Skin closure completed with 4-0 monocryl subcuticular throughout. Tissue adhesive applied. Dry dressing and breast binder applied.  The patient was allowed to wake from anesthesia, extubated and taken to the recovery room in satisfactory condition.   SPECIMENS: right and left breast  DRAINS: 15 Fr JP in right and left breast

## 2020-04-25 DIAGNOSIS — N62 Hypertrophy of breast: Secondary | ICD-10-CM | POA: Diagnosis not present

## 2020-04-25 LAB — SURGICAL PATHOLOGY

## 2020-04-25 NOTE — Discharge Summary (Signed)
Physician Discharge Summary  Patient ID: [name] MRN: [id number] DOB/AGE: [birth year] 54 y.o.  Admit date: 04/24/2020 Discharge date: 04/25/2020  Admission Diagnoses: Macromastia chronic neck and back pain  Discharge Diagnoses:  Same  Discharged Condition: stable  Hospital Course: Postoperatively patient able to ambulate in room, tolerated diet and pain controlled with oral medication. Instructed on drain care, bathing, incision care. Patient maintained on home CPAP without problems overnight.  Treatments: surgery: bilateral breast reduction 5.25.21  Discharge Exam: Blood pressure 98/61, pulse 67, temperature (!) 97.5 F (36.4 C), temperature source Oral, resp. rate 18, height 5\' 5"  (1.651 m), weight 126.6 kg, SpO2 94 %. Incision/Wound: incisions intact developing ecchymoses without hematoma breasts soft drains serosanguinous  Disposition: Discharge disposition: 01-Home or Self Care       Discharge Instructions    Call MD for:  redness, tenderness, or signs of infection (pain, swelling, bleeding, redness, odor or green/yellow discharge around incision site)   Complete by: As directed    Call MD for:  temperature >100.5   Complete by: As directed    Discharge instructions   Complete by: As directed    Ok to remove dressings and shower am 5.27.21. Soap and water ok, pat incisions dry. No creams or ointments over incisions. Do not let drains dangle in shower, attach to lanyard or similar.Strip and record drains twice daily and bring log to clinic visit.  Breast binder or soft compression bra all other times.  Ok to raise arms above shoulders for bathing and dressing.  No house yard work or exercise until cleared by MD.   Patient received all Rx preop. Ice packs to chest for comfort.   Driving Restrictions   Complete by: As directed    No driving if taking narcotics   Lifting restrictions   Complete by: As directed    No lifting > 5 lbs for 2 weeks   Resume previous  diet   Complete by: As directed      Allergies as of 04/25/2020      Reactions   Avelox [moxifloxacin Hcl In Nacl] Other (See Comments)   Unknown reaction   Codeine Other (See Comments)   Unknown reaction      Medication List    TAKE these medications   albuterol (2.5 MG/3ML) 0.083% nebulizer solution Commonly known as: PROVENTIL Take 2.5 mg by nebulization every 6 (six) hours as needed. For shortness of breath   albuterol 108 (90 Base) MCG/ACT inhaler Commonly known as: VENTOLIN HFA Inhale 2 puffs into the lungs every 4 (four) hours as needed for wheezing or shortness of breath.   allopurinol 300 MG tablet Commonly known as: ZYLOPRIM Take 300 mg by mouth daily as needed (gout flare up.).   CENTRUM SILVER 50+WOMEN PO Take 1 tablet by mouth at bedtime.   citalopram 20 MG tablet Commonly known as: CELEXA Take 20 mg by mouth daily.   colchicine 0.6 MG tablet Take 0.6 mg by mouth daily as needed (gout flare up.).   Fluticasone-Umeclidin-Vilant 100-62.5-25 MCG/INH Aepb Inhale 1 puff into the lungs daily.   gabapentin 300 MG capsule Commonly known as: NEURONTIN Take 300 mg by mouth 3 (three) times daily.   loratadine 10 MG tablet Commonly known as: CLARITIN Take 10 mg by mouth daily.   omeprazole 40 MG capsule Commonly known as: PRILOSEC Take 40 mg by mouth daily.   potassium chloride SA 20 MEQ tablet Commonly known as: KLOR-CON Take 1 tablet (20 mEq total) by mouth daily.  tiZANidine 4 MG tablet Commonly known as: ZANAFLEX Take 4 mg by mouth every 8 (eight) hours as needed for muscle spasms. What changed: Another medication with the same name was removed. Continue taking this medication, and follow the directions you see here.      Follow-up Information    Irene Limbo, MD In 1 week.   Specialty: Plastic Surgery Why: as scheduled Contact information: [address] [place] [address] [id number]           Signed: Irene Limbo 04/25/2020, 7:34 AM

## 2020-04-25 NOTE — Plan of Care (Signed)

## 2020-04-25 NOTE — Progress Notes (Signed)
[name] to be D/C'd  per MD order. Discussed with the patient and all questions fully answered.  VSS, Skin clean, dry and intact without evidence of skin break down, no evidence of skin tears noted.  IV catheter discontinued intact. Site without signs and symptoms of complications. Dressing and pressure applied.  An After Visit Summary was printed and given to the patient.   D/c education completed with patient/family including follow up instructions, medication list, d/c activities limitations if indicated, with other d/c instructions as indicated by MD - patient able to verbalize understanding, all questions fully answered.   Patient instructed to return to ED, call 911, or call MD for any changes in condition.   Patient to be escorted via [place], and D/C home via private auto.

## 2021-07-06 ENCOUNTER — Encounter (HOSPITAL_COMMUNITY): Payer: Self-pay | Admitting: Emergency Medicine

## 2021-07-06 ENCOUNTER — Emergency Department (HOSPITAL_COMMUNITY)
Admission: EM | Admit: 2021-07-06 | Discharge: 2021-07-07 | Disposition: A | Discharge status: Home / Self Care | Payer: Managed Care, Other (non HMO) | Attending: Emergency Medicine | Admitting: Emergency Medicine

## 2021-07-06 ENCOUNTER — Emergency Department (HOSPITAL_COMMUNITY): Payer: Managed Care, Other (non HMO)

## 2021-07-06 DIAGNOSIS — Z7951 Long term (current) use of inhaled steroids: Secondary | ICD-10-CM | POA: Insufficient documentation

## 2021-07-06 DIAGNOSIS — R600 Localized edema: Secondary | ICD-10-CM | POA: Diagnosis not present

## 2021-07-06 DIAGNOSIS — J45909 Unspecified asthma, uncomplicated: Secondary | ICD-10-CM | POA: Insufficient documentation

## 2021-07-06 DIAGNOSIS — I10 Essential (primary) hypertension: Secondary | ICD-10-CM | POA: Insufficient documentation

## 2021-07-06 DIAGNOSIS — Z79899 Other long term (current) drug therapy: Secondary | ICD-10-CM | POA: Diagnosis not present

## 2021-07-06 DIAGNOSIS — J4541 Moderate persistent asthma with (acute) exacerbation: Secondary | ICD-10-CM | POA: Diagnosis not present

## 2021-07-06 DIAGNOSIS — Z20822 Contact with and (suspected) exposure to covid-19: Secondary | ICD-10-CM | POA: Insufficient documentation

## 2021-07-06 DIAGNOSIS — Z87891 Personal history of nicotine dependence: Secondary | ICD-10-CM | POA: Diagnosis not present

## 2021-07-06 DIAGNOSIS — R609 Edema, unspecified: Secondary | ICD-10-CM

## 2021-07-06 DIAGNOSIS — R0602 Shortness of breath: Secondary | ICD-10-CM | POA: Diagnosis present

## 2021-07-06 LAB — CBC WITH DIFFERENTIAL/PLATELET
Abs Immature Granulocytes: 0.03 10*3/uL (ref 0.00–0.07)
Basophils Absolute: 0.1 10*3/uL (ref 0.0–0.1)
Basophils Relative: 1 %
Eosinophils Absolute: 0.4 10*3/uL (ref 0.0–0.5)
Eosinophils Relative: 4 %
HCT: 40.7 % (ref 36.0–46.0)
Hemoglobin: 13.7 g/dL (ref 12.0–15.0)
Immature Granulocytes: 0 %
Lymphocytes Relative: 41 %
Lymphs Abs: 4.9 10*3/uL — ABNORMAL HIGH (ref 0.7–4.0)
MCH: 30.5 pg (ref 26.0–34.0)
MCHC: 33.7 g/dL (ref 30.0–36.0)
MCV: 90.6 fL (ref 80.0–100.0)
Monocytes Absolute: 1.3 10*3/uL — ABNORMAL HIGH (ref 0.1–1.0)
Monocytes Relative: 11 %
Neutro Abs: 5.1 10*3/uL (ref 1.7–7.7)
Neutrophils Relative %: 43 %
Platelets: 326 10*3/uL (ref 150–400)
RBC: 4.49 MIL/uL (ref 3.87–5.11)
RDW: 15.9 % — ABNORMAL HIGH (ref 11.5–15.5)
WBC: 11.8 10*3/uL — ABNORMAL HIGH (ref 4.0–10.5)
nRBC: 0 % (ref 0.0–0.2)

## 2021-07-06 LAB — COMPREHENSIVE METABOLIC PANEL
ALT: 29 U/L (ref 0–44)
AST: 38 U/L (ref 15–41)
Albumin: 3.8 g/dL (ref 3.5–5.0)
Alkaline Phosphatase: 142 U/L — ABNORMAL HIGH (ref 38–126)
Anion gap: 9 (ref 5–15)
BUN: 15 mg/dL (ref 6–20)
CO2: 25 mmol/L (ref 22–32)
Calcium: 9 mg/dL (ref 8.9–10.3)
Chloride: 104 mmol/L (ref 98–111)
Creatinine, Ser: 0.68 mg/dL (ref 0.44–1.00)
GFR, Estimated: >60 mL/min (ref >60)
Glucose, Bld: 116 mg/dL — ABNORMAL HIGH (ref 70–99)
Potassium: 4.1 mmol/L (ref 3.5–5.1)
Sodium: 138 mmol/L (ref 135–145)
Total Bilirubin: 0.7 mg/dL (ref 0.3–1.2)
Total Protein: 8 g/dL (ref 6.5–8.1)

## 2021-07-06 LAB — TROPONIN I (HIGH SENSITIVITY): Troponin I (High Sensitivity): 7 ng/L (ref <18)

## 2021-07-06 LAB — BRAIN NATRIURETIC PEPTIDE: B Natriuretic Peptide: 83.4 pg/mL (ref 0.0–100.0)

## 2021-07-06 MED ORDER — ALBUTEROL SULFATE HFA 108 (90 BASE) MCG/ACT IN AERS: 2.0000 | INHALATION_SPRAY | RESPIRATORY_TRACT | Status: DC | PRN

## 2021-07-06 NOTE — ED Triage Notes (Addendum)
Pt c/o BL leg swelling and shob x 1 week. Pt ran out of home meds. Pt hypoxic in triage with expiratory wheezing. 02 84% on RA. Hx of asthma. Denies chest pain.

## 2021-07-06 NOTE — ED Provider Notes (Signed)
Emergency Medicine Provider Triage Evaluation Note  Maliyani Steinhaus , a 55 y.o. female  was evaluated in triage.  Pt complains of dyspnea x 1 week that is progressively worsening with associated bilateral LE swelling. States she has gained 15 pounds with this. Dyspnea worse with activity.   Hx of sarcoidosis, sleep apena, hypertension.   Review of Systems  Positive: Dyspnea, leg swelling Negative: Chest pain, nausea, vomiting  Physical Exam  BP (!) 174/100 (BP Location: Right Arm)   Pulse 86   Temp 98.5 F (36.9 C) (Oral)   Resp (!) 24   SpO2 92%  Gen:   Awake, no distress   Resp:  Hypoxic on RA- supplement o2 applied in triage, mildly tachypneic, decreased breath sounds @ the bases MSK:   Moves extremities without difficulty  Other:  Pitting edema to BLE.   Medical Decision Making  Medically screening exam initiated at 10:29 PM.  Appropriate orders placed.  Miciah Maginnis was informed that the remainder of the evaluation will be completed by another provider, this initial triage assessment does not replace that evaluation, and the importance of remaining in the ED until their evaluation is complete.  Dyspnea   Amaryllis Dyke, PA-C 07/07/21 2320    Charlesetta Shanks, MD 08/04/21 1439

## 2021-07-07 LAB — RESP PANEL BY RT-PCR (FLU A&B, COVID) ARPGX2
Influenza A by PCR: NEGATIVE
Influenza B by PCR: NEGATIVE
SARS Coronavirus 2 by RT PCR: NEGATIVE

## 2021-07-07 LAB — TROPONIN I (HIGH SENSITIVITY): Troponin I (High Sensitivity): 8 ng/L (ref <18)

## 2021-07-07 MED ORDER — ALBUTEROL (5 MG/ML) CONTINUOUS INHALATION SOLN
10.0000 mg/h | INHALATION_SOLUTION | Freq: Once | RESPIRATORY_TRACT | Status: AC
  Administered 2021-07-07: 10 mg/h via RESPIRATORY_TRACT
  Filled 2021-07-07: qty 20

## 2021-07-07 MED ORDER — POTASSIUM CHLORIDE CRYS ER 20 MEQ PO TBCR: 20.0000 meq | EXTENDED_RELEASE_TABLET | Freq: Every day | ORAL | 0 refills | Status: AC

## 2021-07-07 MED ORDER — LOSARTAN POTASSIUM 50 MG PO TABS: 50.0000 mg | ORAL_TABLET | Freq: Every day | ORAL | 4 refills | Status: AC

## 2021-07-07 MED ORDER — FUROSEMIDE 20 MG PO TABS: 20.0000 mg | ORAL_TABLET | Freq: Every day | ORAL | 0 refills | Status: AC

## 2021-07-07 MED ORDER — OMEPRAZOLE 40 MG PO CPDR: 40.0000 mg | DELAYED_RELEASE_CAPSULE | Freq: Every day | ORAL | 4 refills | Status: AC

## 2021-07-07 MED ORDER — FLUTICASONE-UMECLIDIN-VILANT 100-62.5-25 MCG/INH IN AEPB: 1.0000 | INHALATION_SPRAY | Freq: Every day | RESPIRATORY_TRACT | 4 refills | Status: AC

## 2021-07-07 MED ORDER — ALBUTEROL SULFATE HFA 108 (90 BASE) MCG/ACT IN AERS: 2.0000 | INHALATION_SPRAY | RESPIRATORY_TRACT | 3 refills | Status: AC | PRN

## 2021-07-07 MED ORDER — CITALOPRAM HYDROBROMIDE 20 MG PO TABS: 20.0000 mg | ORAL_TABLET | Freq: Every day | ORAL | 4 refills | Status: AC

## 2021-07-07 MED ORDER — PREDNISONE 20 MG PO TABS: 40.0000 mg | ORAL_TABLET | Freq: Every day | ORAL | 0 refills | Status: AC

## 2021-07-07 MED ORDER — FUROSEMIDE 10 MG/ML IJ SOLN
40.0000 mg | Freq: Once | INTRAMUSCULAR | Status: AC
  Administered 2021-07-07: 40 mg via INTRAVENOUS
  Filled 2021-07-07: qty 4

## 2021-07-07 MED ORDER — ALBUTEROL SULFATE HFA 108 (90 BASE) MCG/ACT IN AERS: 2.0000 | INHALATION_SPRAY | RESPIRATORY_TRACT | 3 refills | Status: DC | PRN

## 2021-07-07 MED ORDER — ALBUTEROL SULFATE (2.5 MG/3ML) 0.083% IN NEBU: 2.5000 mg | INHALATION_SOLUTION | Freq: Four times a day (QID) | RESPIRATORY_TRACT | 10 refills | Status: AC | PRN

## 2021-07-07 MED ORDER — TIZANIDINE HCL 4 MG PO TABS: 4.0000 mg | ORAL_TABLET | Freq: Three times a day (TID) | ORAL | 4 refills | Status: AC | PRN

## 2021-07-07 MED ORDER — DOXYCYCLINE HYCLATE 100 MG PO CAPS: 100.0000 mg | ORAL_CAPSULE | Freq: Two times a day (BID) | ORAL | 0 refills | Status: AC

## 2021-07-07 NOTE — ED Notes (Signed)
Patient ambulated to bathroom. SpO2 remained between 88-95% while ambulating. Patient reports feeling fine.

## 2021-07-07 NOTE — ED Provider Notes (Signed)
[facility] DEPT Provider Note   CSN: [id number] Arrival date & time: 07/06/21  2143     History Chief Complaint  Patient presents with  . Leg Swelling  . Shortness of Breath    [name] is a 55 y.o. female.  Patient presents to the emergency department for evaluation of leg swelling and shortness of breath.  Patient reports that for the last week she has had progressively worsening swelling of both of her legs.  She ran out of her losartan several days ago.  She currently does not have a PCP, has an appointment for December.  Patient does have a history of asthma.  She has had some wheezing and feels short of breath.  No chest pain.      Past Medical History:  Diagnosis Date  . Anxiety   . Asthma   . Depression   . GERD (gastroesophageal reflux disease)   . H/O Bell's palsy   . H/O: pneumonia   . Herniated disc, cervical   . Hypertension   . ITP (idiopathic thrombocytopenic purpura)    Dr. Earlie Server  . Pneumonia   . Pre-diabetes   . Sarcoidosis   . Sleep apnea     Patient Active Problem List   Diagnosis Date Noted  . Macromastia 04/24/2020  . Pleural effusion 01/13/2012  . Bronchospasm 01/08/2012  . OSA on CPAP 01/08/2012  . Pancreatitis 01/07/2012  . Elevated LFTs 01/07/2012  . Cough 01/07/2012  . Hyperproteinemia 01/07/2012  . Hypoalbuminemia 01/07/2012  . Leukocytosis 01/07/2012  . Hyponatremia 01/07/2012  . Community acquired pneumonia 01/07/2012  . Hypertension   . Depression   . SARCOIDOSIS (ANY SITE) 09/21/2008  . DYSPNEA 09/21/2008    Past Surgical History:  Procedure Laterality Date  . BACK SURGERY    . BONE MARROW BIOPSY     12/2001  . BREAST REDUCTION SURGERY Bilateral 04/24/2020   Procedure: BILATERAL BREAST REDUCTION;  Surgeon: Irene Limbo, MD;  Location: [facility];  Service: Plastics;  Laterality: Bilateral;  . Cesarian section     x2  . CHOLECYSTECTOMY    . LAPAROSCOPIC SPLENECTOMY     04/05/2009  .  Left stapedectomy    . PERCUTANEOUS LIVER BIOPSY     x2  . TUBAL LIGATION       OB History   No obstetric history on file.     Family History  Adopted: Yes    Social History   Tobacco Use  . Smoking status: Former    Packs/day: 0.50    Years: 5.00    Pack years: 2.50    Types: Cigarettes  . Smokeless tobacco: Never  Vaping Use  . Vaping Use: Never used  Substance Use Topics  . Alcohol use: No  . Drug use: No    Home Medications Prior to Admission medications   Medication Sig Start Date End Date Taking? Authorizing Provider  doxycycline (VIBRAMYCIN) 100 MG capsule Take 1 capsule (100 mg total) by mouth 2 (two) times daily. 07/07/21  Yes Story Conti, Gwenyth Allegra, MD  furosemide (LASIX) 20 MG tablet Take 1 tablet (20 mg total) by mouth daily. 07/07/21  Yes Michelle Vanhise, Gwenyth Allegra, MD  potassium chloride SA (KLOR-CON) 20 MEQ tablet Take 1 tablet (20 mEq total) by mouth daily. 07/07/21  Yes Lakeishia Truluck, Gwenyth Allegra, MD  predniSONE (DELTASONE) 20 MG tablet Take 2 tablets (40 mg total) by mouth daily with breakfast. 07/07/21  Yes Peggy Loge, Gwenyth Allegra, MD  albuterol (PROVENTIL) (2.5 MG/3ML) 0.083% nebulizer solution  Take 3 mLs (2.5 mg total) by nebulization every 6 (six) hours as needed. For shortness of breath 07/07/21   Orpah Greek, MD  albuterol (VENTOLIN HFA) 108 (90 Base) MCG/ACT inhaler Inhale 2 puffs into the lungs every 4 (four) hours as needed for wheezing or shortness of breath. 07/07/21   Orpah Greek, MD  citalopram (CELEXA) 20 MG tablet Take 1 tablet (20 mg total) by mouth daily. 07/07/21   Orpah Greek, MD  Fluticasone-Umeclidin-Vilant 100-62.5-25 MCG/INH AEPB Inhale 1 puff into the lungs daily. 07/07/21   Orpah Greek, MD  losartan (COZAAR) 50 MG tablet Take 1 tablet (50 mg total) by mouth daily. 07/07/21   Orpah Greek, MD  omeprazole (PRILOSEC) 40 MG capsule Take 1 capsule (40 mg total) by mouth daily. 07/07/21   Orpah Greek,  MD  tiZANidine (ZANAFLEX) 4 MG tablet Take 1 tablet (4 mg total) by mouth every 8 (eight) hours as needed for muscle spasms. 07/07/21   Orpah Greek, MD    Allergies    Avelox [moxifloxacin hcl in nacl] and Codeine  Review of Systems   Review of Systems  Respiratory:  Positive for shortness of breath and wheezing.   Cardiovascular:  Positive for leg swelling. Negative for chest pain.  All other systems reviewed and are negative.  Physical Exam Updated Vital Signs BP (!) 122/111   Pulse (!) 106   Temp 98.5 F (36.9 C) (Oral)   Resp 19   SpO2 94%   Physical Exam Vitals and nursing note reviewed.  Constitutional:      General: She is not in acute distress.    Appearance: Normal appearance. She is well-developed.  HENT:     Head: Normocephalic and atraumatic.     Right Ear: Hearing normal.     Left Ear: Hearing normal.     Nose: Nose normal.  Eyes:     Conjunctiva/sclera: Conjunctivae normal.     Pupils: Pupils are equal, round, and reactive to light.  Cardiovascular:     Rate and Rhythm: Regular rhythm.     Heart sounds: S1 normal and S2 normal. No murmur heard.   No friction rub. No gallop.  Pulmonary:     Effort: Pulmonary effort is normal. No respiratory distress.     Breath sounds: Wheezing and rales present.  Chest:     Chest wall: No tenderness.  Abdominal:     General: Bowel sounds are normal.     Palpations: Abdomen is soft.     Tenderness: There is no abdominal tenderness. There is no guarding or rebound. Negative signs include Murphy's sign and McBurney's sign.     Hernia: No hernia is present.  Musculoskeletal:        General: Normal range of motion.     Cervical back: Normal range of motion and neck supple.     Right lower leg: Edema present.     Left lower leg: Edema present.  Skin:    General: Skin is warm and dry.     Findings: No rash.  Neurological:     Mental Status: She is alert and oriented to person, place, and time.     GCS: GCS  eye subscore is 4. GCS verbal subscore is 5. GCS motor subscore is 6.     Cranial Nerves: No cranial nerve deficit.     Sensory: No sensory deficit.     Coordination: Coordination normal.  Psychiatric:        Speech:  Speech normal.        Behavior: Behavior normal.        Thought Content: Thought content normal.    ED Results / Procedures / Treatments   Labs (all labs ordered are listed, but only abnormal results are displayed) Labs Reviewed  COMPREHENSIVE METABOLIC PANEL - Abnormal; Notable for the following components:      Result Value   Glucose, Bld 116 (*)    Alkaline Phosphatase 142 (*)    All other components within normal limits  CBC WITH DIFFERENTIAL/PLATELET - Abnormal; Notable for the following components:   WBC 11.8 (*)    RDW 15.9 (*)    Lymphs Abs 4.9 (*)    Monocytes Absolute 1.3 (*)    All other components within normal limits  RESP PANEL BY RT-PCR (FLU A&B, COVID) ARPGX2  BRAIN NATRIURETIC PEPTIDE  TROPONIN I (HIGH SENSITIVITY)  TROPONIN I (HIGH SENSITIVITY)    EKG EKG Interpretation  Date/Time:  Saturday July 06 2021 22:24:17 EDT Ventricular Rate:  75 PR Interval:  169 QRS Duration: 111 QT Interval:  412 QTC Calculation: 461 R Axis:   66 Text Interpretation: Sinus or ectopic atrial rhythm Probable anteroseptal infarct, recent Lateral leads are also involved 12 Lead; Mason-Likar No significant change since last tracing Confirmed by Orpah Greek [phone]) on 07/07/2021 12:04:11 AM  Radiology DG Chest 2 View  Result Date: 07/06/2021 CLINICAL DATA:  Short S of breath EXAM: CHEST - 2 VIEW COMPARISON:  03/25/2021 FINDINGS: Heart is normal size. No confluent airspace opacities or effusions. No overt edema. No acute bony abnormality. IMPRESSION: No active cardiopulmonary disease. Electronically Signed   By: Rolm Baptise M.D.   On: 07/06/2021 22:44    Procedures Procedures   Medications Ordered in ED Medications  albuterol (VENTOLIN HFA) 108 (90  Base) MCG/ACT inhaler 2 puff (has no administration in time range)  furosemide (LASIX) injection 40 mg (40 mg Intravenous Given 07/07/21 0037)  albuterol (PROVENTIL,VENTOLIN) solution continuous neb (10 mg/hr Nebulization Given 07/07/21 0201)    ED Course  I have reviewed the triage vital signs and the nursing notes.  Pertinent labs & imaging results that were available during my care of the patient were reviewed by me and considered in my medical decision making (see chart for details).    MDM Rules/Calculators/A&P                           Patient presents to the emergency department for evaluation of generalized swelling.  Patient with progressive swelling of her legs.  Symptoms have developed over the past week.  No known history of congestive heart failure.  Does not take diuretics.  Patient is short of breath but has a history of asthma.  She does have wheezing at arrival.  Work-up does not suggest pulmonary edema.  BNP normal.  Albumin normal.  Chest x-ray clear.  Wheezing cleared with albuterol.  Patient has ambulated in the department, was asymptomatic and oxygen saturations remained in the 90s.  She will therefore be discharged with short course of Lasix.  We will also prescribe a burst of prednisone, continue albuterol.  Patient does not currently have a primary doctor, will refill chronic meds.  Final Clinical Impression(s) / ED Diagnoses Final diagnoses:  Peripheral edema  Moderate persistent asthma with acute exacerbation    Rx / DC Orders ED Discharge Orders          Ordered    albuterol (VENTOLIN  HFA) 108 (90 Base) MCG/ACT inhaler  Every 4 hours PRN,   Status:  Discontinued        07/07/21 0618    albuterol (PROVENTIL) (2.5 MG/3ML) 0.083% nebulizer solution  Every 6 hours PRN        07/07/21 0618    citalopram (CELEXA) 20 MG tablet  Daily        07/07/21 0618    Fluticasone-Umeclidin-Vilant 100-62.5-25 MCG/INH AEPB  Daily        07/07/21 0618    losartan (COZAAR) 50 MG  tablet  Daily        07/07/21 0618    omeprazole (PRILOSEC) 40 MG capsule  Daily        07/07/21 0618    tiZANidine (ZANAFLEX) 4 MG tablet  Every 8 hours PRN        07/07/21 0618    predniSONE (DELTASONE) 20 MG tablet  Daily with breakfast        07/07/21 0620    doxycycline (VIBRAMYCIN) 100 MG capsule  2 times daily        07/07/21 0620    albuterol (VENTOLIN HFA) 108 (90 Base) MCG/ACT inhaler  Every 4 hours PRN        07/07/21 0620    furosemide (LASIX) 20 MG tablet  Daily        07/07/21 0620    potassium chloride SA (KLOR-CON) 20 MEQ tablet  Daily        07/07/21 0620             Orpah Greek, MD 07/07/21 [phone]

## 2021-10-08 IMAGING — CR DG CHEST 2V
2 series · 2 of 2 positions shown · non-contrast
Comparison: 03/25/2021

CLINICAL DATA: Short S of breath

EXAM:
CHEST - 2 VIEW

[w chest pa]
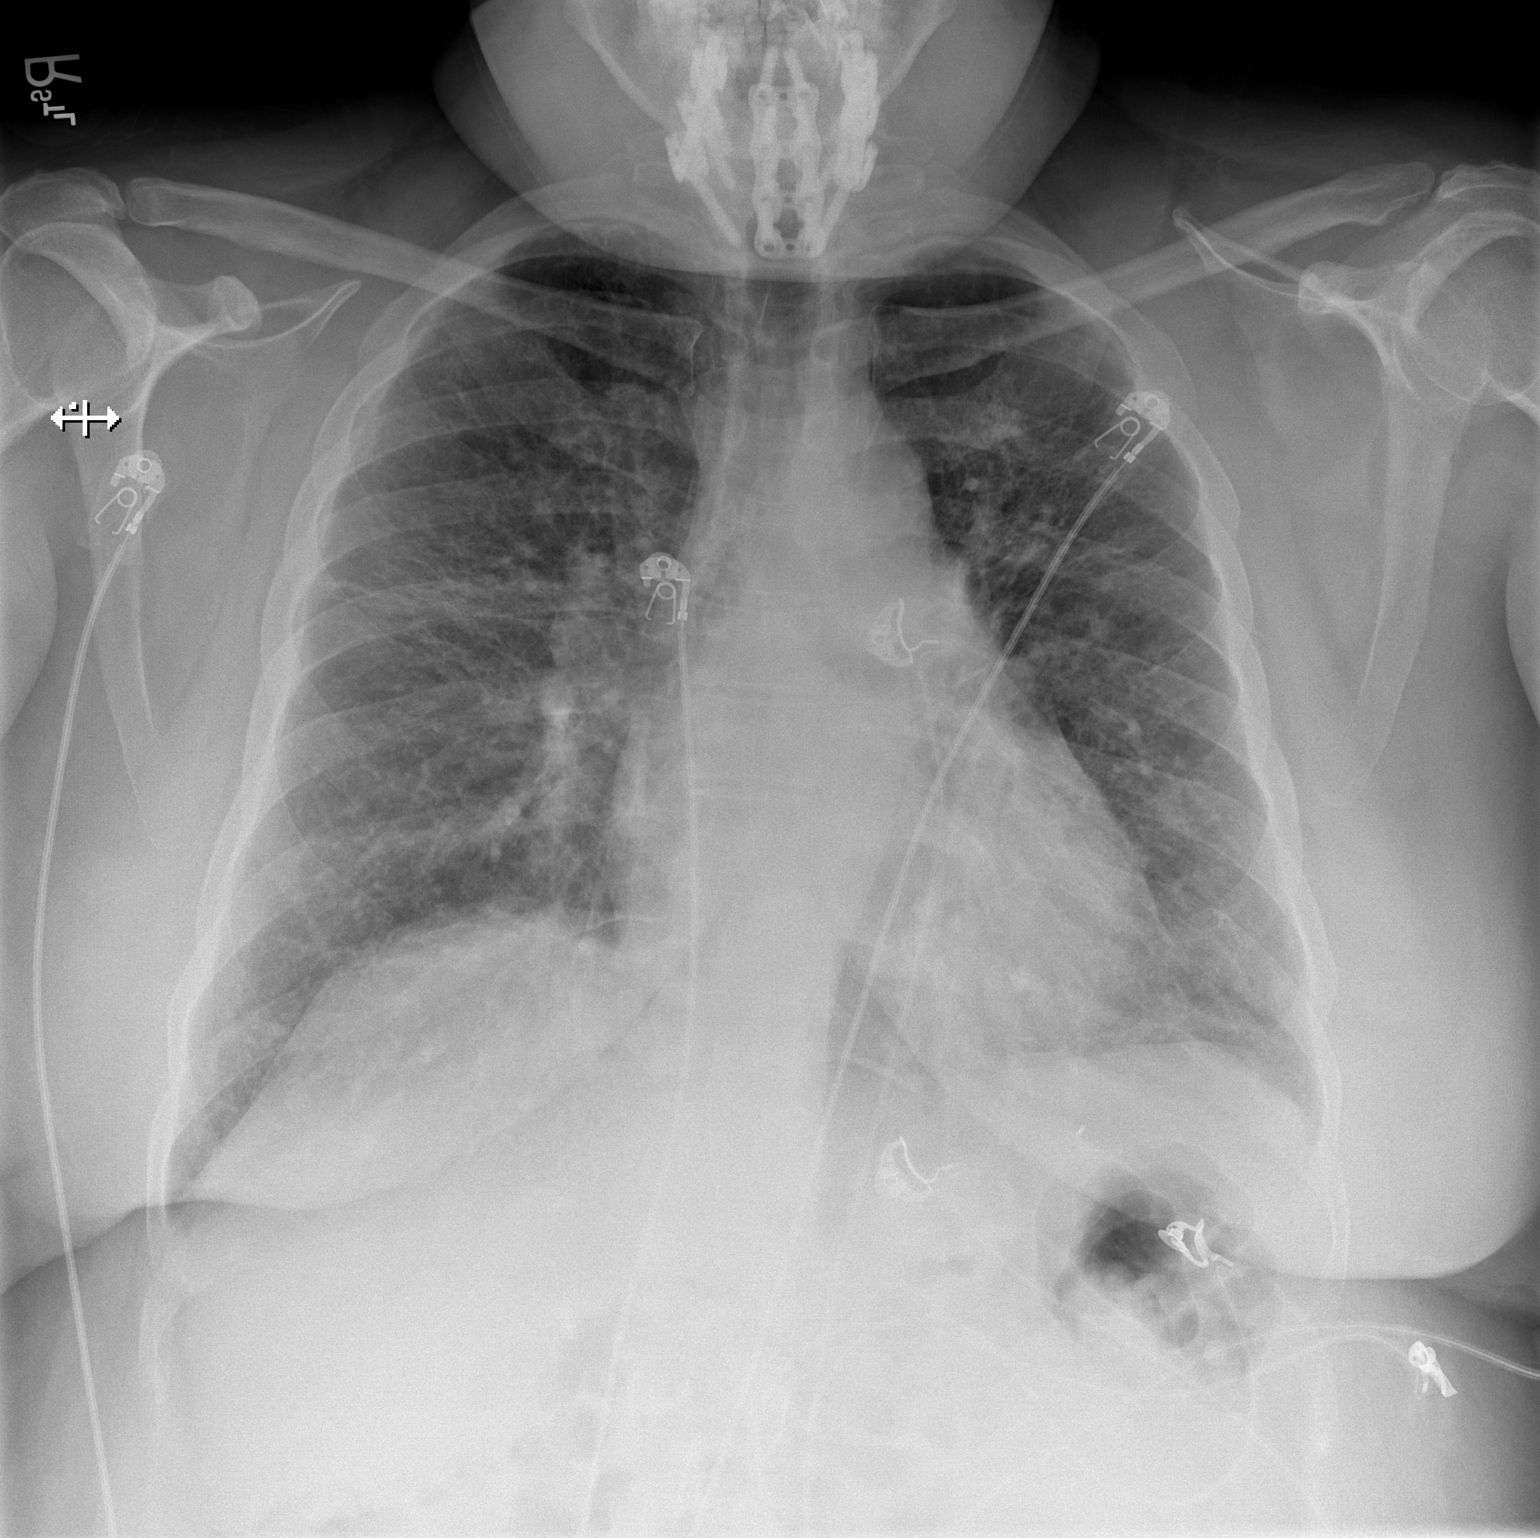

[w chest lat]
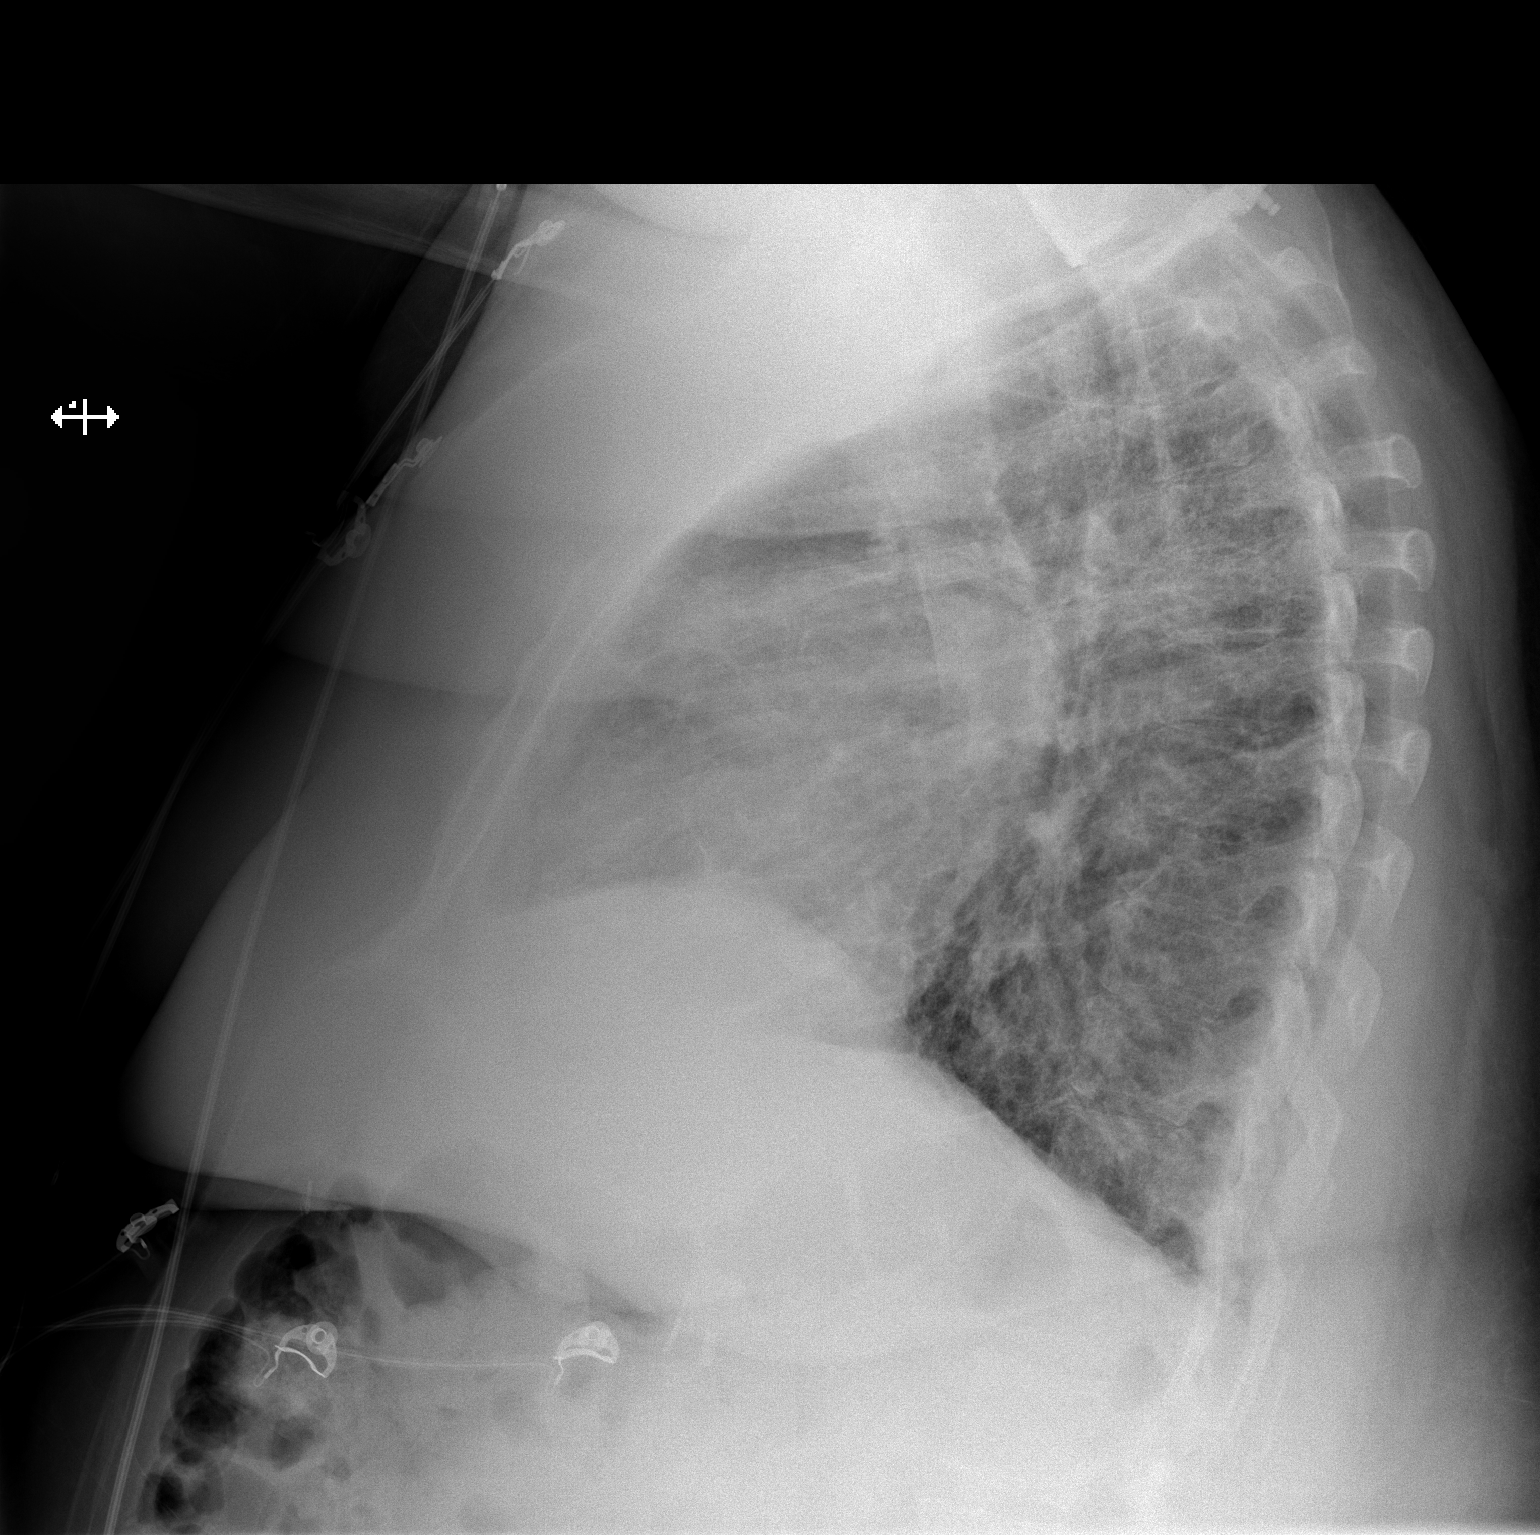

[2 of 2 positions shown; findings below may reference images not displayed]

FINDINGS: Heart is normal size. No confluent airspace opacities or effusions.
No overt edema. No acute bony abnormality.
IMPRESSION: No active cardiopulmonary disease.
